# Patient Record
Sex: Female | Born: 1945
Health system: Southern US, Community
[De-identification: ages and names within clinical notes are randomized; demographics above are authoritative.]

## PROBLEM LIST (undated history)

## (undated) DIAGNOSIS — S3992XA Unspecified injury of lower back, initial encounter: Secondary | ICD-10-CM

## (undated) DIAGNOSIS — K649 Unspecified hemorrhoids: Secondary | ICD-10-CM

## (undated) DIAGNOSIS — M1990 Unspecified osteoarthritis, unspecified site: Secondary | ICD-10-CM

## (undated) DIAGNOSIS — I10 Essential (primary) hypertension: Secondary | ICD-10-CM

## (undated) DIAGNOSIS — K219 Gastro-esophageal reflux disease without esophagitis: Secondary | ICD-10-CM

## (undated) DIAGNOSIS — Z889 Allergy status to unspecified drugs, medicaments and biological substances status: Secondary | ICD-10-CM

## (undated) HISTORY — PX: TUBAL LIGATION: SHX77

## (undated) HISTORY — PX: LAMINECTOMY: SHX219

## (undated) HISTORY — PX: COLONOSCOPY: SHX174

## (undated) HISTORY — PX: CERVIX SURGERY: SHX593

---

## 2011-06-28 ENCOUNTER — Emergency Department (HOSPITAL_COMMUNITY): Payer: Self-pay

## 2011-06-28 ENCOUNTER — Inpatient Hospital Stay (HOSPITAL_COMMUNITY)
Admission: EM | Admit: 2011-06-28 | Discharge: 2011-06-30 | DRG: 917 | Disposition: A | Discharge status: Home / Self Care | Payer: Self-pay | Attending: Internal Medicine | Admitting: Internal Medicine

## 2011-06-28 ENCOUNTER — Encounter (HOSPITAL_COMMUNITY): Payer: Self-pay

## 2011-06-28 DIAGNOSIS — K219 Gastro-esophageal reflux disease without esophagitis: Secondary | ICD-10-CM

## 2011-06-28 DIAGNOSIS — G929 Unspecified toxic encephalopathy: Secondary | ICD-10-CM | POA: Diagnosis present

## 2011-06-28 DIAGNOSIS — G92 Toxic encephalopathy: Secondary | ICD-10-CM | POA: Diagnosis present

## 2011-06-28 DIAGNOSIS — G43909 Migraine, unspecified, not intractable, without status migrainosus: Secondary | ICD-10-CM | POA: Insufficient documentation

## 2011-06-28 DIAGNOSIS — J329 Chronic sinusitis, unspecified: Secondary | ICD-10-CM | POA: Diagnosis present

## 2011-06-28 DIAGNOSIS — R5383 Other fatigue: Secondary | ICD-10-CM

## 2011-06-28 DIAGNOSIS — I10 Essential (primary) hypertension: Secondary | ICD-10-CM

## 2011-06-28 DIAGNOSIS — G9340 Encephalopathy, unspecified: Secondary | ICD-10-CM | POA: Diagnosis present

## 2011-06-28 DIAGNOSIS — R5381 Other malaise: Secondary | ICD-10-CM | POA: Diagnosis present

## 2011-06-28 DIAGNOSIS — E876 Hypokalemia: Secondary | ICD-10-CM | POA: Diagnosis present

## 2011-06-28 DIAGNOSIS — R4182 Altered mental status, unspecified: Secondary | ICD-10-CM

## 2011-06-28 DIAGNOSIS — T424X1A Poisoning by benzodiazepines, accidental (unintentional), initial encounter: Secondary | ICD-10-CM | POA: Diagnosis present

## 2011-06-28 DIAGNOSIS — T400X1A Poisoning by opium, accidental (unintentional), initial encounter: Secondary | ICD-10-CM | POA: Diagnosis present

## 2011-06-28 DIAGNOSIS — T424X4A Poisoning by benzodiazepines, undetermined, initial encounter: Principal | ICD-10-CM | POA: Diagnosis present

## 2011-06-28 DIAGNOSIS — T4271XA Poisoning by unspecified antiepileptic and sedative-hypnotic drugs, accidental (unintentional), initial encounter: Secondary | ICD-10-CM | POA: Diagnosis present

## 2011-06-28 HISTORY — DX: Gastro-esophageal reflux disease without esophagitis: K21.9

## 2011-06-28 HISTORY — DX: Essential (primary) hypertension: I10

## 2011-06-28 LAB — COMPREHENSIVE METABOLIC PANEL
AST: 21 U/L (ref 0–37)
BUN: 15 mg/dL (ref 6–23)
CO2: 32 mEq/L (ref 19–32)
Calcium: 9.3 mg/dL (ref 8.4–10.5)
Creatinine, Ser: 0.77 mg/dL (ref 0.50–1.10)
GFR calc non Af Amer: 88 mL/min — ABNORMAL LOW (ref >90)

## 2011-06-28 LAB — AMMONIA: Ammonia: 57 umol/L (ref 11–60)

## 2011-06-28 LAB — BLOOD GAS, ARTERIAL
Acid-Base Excess: 4.4 mmol/L — ABNORMAL HIGH (ref 0.0–2.0)
Bicarbonate: 28.8 mEq/L — ABNORMAL HIGH (ref 20.0–24.0)
TCO2: 25.3 mmol/L (ref 0–100)
pCO2 arterial: 45.7 mmHg — ABNORMAL HIGH (ref 35.0–45.0)
pO2, Arterial: 80.7 mmHg (ref 80.0–100.0)

## 2011-06-28 LAB — SALICYLATE LEVEL: Salicylate Lvl: <2 mg/dL — ABNORMAL LOW (ref 2.8–20.0)

## 2011-06-28 LAB — HEPATIC FUNCTION PANEL
AST: 22 U/L (ref 0–37)
Bilirubin, Direct: 0.1 mg/dL (ref 0.0–0.3)
Indirect Bilirubin: 0.5 mg/dL (ref 0.3–0.9)

## 2011-06-28 LAB — DIFFERENTIAL
Basophils Absolute: 0.1 10*3/uL (ref 0.0–0.1)
Basophils Relative: 1 % (ref 0–1)
Neutro Abs: 2.6 10*3/uL (ref 1.7–7.7)
Neutrophils Relative %: 43 % (ref 43–77)

## 2011-06-28 LAB — URINALYSIS, ROUTINE W REFLEX MICROSCOPIC
Nitrite: NEGATIVE
Specific Gravity, Urine: 1.01 (ref 1.005–1.030)
Urobilinogen, UA: 0.2 mg/dL (ref 0.0–1.0)

## 2011-06-28 LAB — RAPID URINE DRUG SCREEN, HOSP PERFORMED
Barbiturates: NOT DETECTED
Cocaine: NOT DETECTED
Opiates: POSITIVE — AB

## 2011-06-28 LAB — URINE CULTURE
Culture  Setup Time: 201301120151
Culture: NO GROWTH

## 2011-06-28 LAB — PROTIME-INR: Prothrombin Time: 14 seconds (ref 11.6–15.2)

## 2011-06-28 LAB — MRSA PCR SCREENING: MRSA by PCR: NEGATIVE

## 2011-06-28 LAB — CARDIAC PANEL(CRET KIN+CKTOT+MB+TROPI): Total CK: 36 U/L (ref 7–177)

## 2011-06-28 LAB — CBC
MCHC: 34.9 g/dL (ref 30.0–36.0)
RDW: 13.6 % (ref 11.5–15.5)

## 2011-06-28 LAB — ETHANOL: Alcohol, Ethyl (B): <11 mg/dL (ref 0–11)

## 2011-06-28 LAB — APTT: aPTT: 32 seconds (ref 24–37)

## 2011-06-28 MED ORDER — ASPIRIN 300 MG RE SUPP
300.0000 mg | Freq: Every day | RECTAL | Status: DC
  Filled 2011-06-28 (×2): qty 1

## 2011-06-28 MED ORDER — FLUMAZENIL 0.5 MG/5ML IV SOLN
INTRAVENOUS | Status: AC
  Administered 2011-06-28: 0.2 mg
  Filled 2011-06-28: qty 5

## 2011-06-28 MED ORDER — ENOXAPARIN SODIUM 40 MG/0.4ML ~~LOC~~ SOLN
40.0000 mg | SUBCUTANEOUS | Status: DC
  Administered 2011-06-29 – 2011-06-30 (×2): 40 mg via SUBCUTANEOUS
  Filled 2011-06-28 (×3): qty 0.4

## 2011-06-28 MED ORDER — FLEET ENEMA 7-19 GM/118ML RE ENEM: 1.0000 | ENEMA | Freq: Once | RECTAL | Status: AC | PRN

## 2011-06-28 MED ORDER — POTASSIUM CHLORIDE 10 MEQ/100ML IV SOLN
10.0000 meq | INTRAVENOUS | Status: AC
  Administered 2011-06-28 – 2011-06-29 (×4): 10 meq via INTRAVENOUS
  Filled 2011-06-28: qty 100
  Filled 2011-06-28: qty 300

## 2011-06-28 MED ORDER — ONDANSETRON HCL 4 MG/2ML IJ SOLN: 4.0000 mg | Freq: Four times a day (QID) | INTRAMUSCULAR | Status: DC | PRN

## 2011-06-28 MED ORDER — NALOXONE HCL 0.4 MG/ML IJ SOLN
0.4000 mg | Freq: Once | INTRAMUSCULAR | Status: AC
  Administered 2011-06-28: 0.4 mg via INTRAVENOUS
  Filled 2011-06-28: qty 1

## 2011-06-28 MED ORDER — SODIUM CHLORIDE 0.9 % IV SOLN
INTRAVENOUS | Status: DC
  Administered 2011-06-28 (×2): via INTRAVENOUS

## 2011-06-28 MED ORDER — PANTOPRAZOLE SODIUM 40 MG IV SOLR
40.0000 mg | Freq: Two times a day (BID) | INTRAVENOUS | Status: DC
  Administered 2011-06-28 – 2011-06-29 (×3): 40 mg via INTRAVENOUS
  Filled 2011-06-28 (×3): qty 40

## 2011-06-28 MED ORDER — BISACODYL 10 MG RE SUPP: 10.0000 mg | Freq: Every day | RECTAL | Status: DC | PRN

## 2011-06-28 MED ORDER — POTASSIUM CHLORIDE IN NACL 20-0.9 MEQ/L-% IV SOLN
INTRAVENOUS | Status: DC
  Administered 2011-06-28 – 2011-06-30 (×4): via INTRAVENOUS

## 2011-06-28 MED ORDER — NALOXONE HCL 1 MG/ML IJ SOLN
1.0000 mg | Freq: Once | INTRAMUSCULAR | Status: AC
  Administered 2011-06-28: 1 mg via INTRAVENOUS
  Filled 2011-06-28: qty 2

## 2011-06-28 MED ORDER — FLUMAZENIL 0.5 MG/5ML IV SOLN
0.2000 mg | INTRAVENOUS | Status: DC | PRN
  Administered 2011-06-28: 0.2 mg via INTRAVENOUS

## 2011-06-28 NOTE — ED Notes (Signed)
Pt still drowsy, will open eyes on command and answer questions, follow directions,

## 2011-06-28 NOTE — ED Notes (Signed)
Dr. Clarene Duke in to talk with spouse,  Discussed plan of care with pt and spouse

## 2011-06-28 NOTE — H&P (Signed)
PCP:   [facility]  Chief Complaint:  Unresponsiveness since this morning  HPI: Judy Watts is an 67 y.o. Caucasian female.   Brought in by her husband with a history of unresponsiveness since this morning. Husband says she takes 0.25-0.5 mg of Xanax very occasionally to help her sleep, and very rarely takes some Vicodin for her headache. Does not feel that her drowsiness drug-related to her she was wide awake this morning   Reports that she was alert and appropriate at about 10:30 this morning when he was leaving home; he was going to give out his car and he was expecting her to follow him, but she did not arrive at the garage. She returned home and found the blow open as he had left it, and found his wife lying on the floor poorly responsive, and the wound looked as if she for him and not to place over. He felt she may have had a stroke but did not bring her immediately to hospital. He feels it is about 11:30 when he found her and she arrived in the emergency room at about 3:10.  The emergency room patient was very lethargic, her speech was slurred when she was able to speak, she was poorly arousable to very deep painful stimulus. No focal signs are seen. CAT scan of the head was non-acute; she did respond to intravenous Narcan, 0.4 and then 1 mg but the response was not dramatic.  Hospitalist service was called. Initially the patient was felt to have a mild right lower facial droop. And when aroused with sternal rub she was able to give some history, could not remember anything other than she was supposed to go with her husband to take to garage today; eventually she received trial doses of flumazenil, and after about 0.6 mg he became much more alert. In situation of transfer for an MRI and was abandoned.  Husband reports she also takes one tablet of HCTZ daily  Rewiew of Systems:  The patient denies anorexia, fever, weight loss,, vision loss, decreased hearing, hoarseness, chest pain,  syncope, dyspnea on exertion, peripheral edema, balance deficits, hemoptysis, abdominal pain, melena, hematochezia, severe indigestion/heartburn, hematuria, incontinence, genital sores, muscle weakness, suspicious skin lesions, transient blindness, difficulty walking, depression, unusual weight change, abnormal bleeding, enlarged lymph nodes, angioedema, and breast masses.    Past Medical History  Diagnosis Date  . Migraine   . Hypertension   . GERD (gastroesophageal reflux disease)   . Chronic sinusitis     History reviewed. No pertinent past surgical history.  Medications:  HOME MEDS: Prior to Admission medications   Medication Sig Start Date End Date Taking? Authorizing Provider  ALPRAZolam Prudy Feeler) 0.5 MG tablet Take 0.5 mg by mouth at bedtime as needed. Sleep   Yes Historical Provider, MD  aspirin EC 81 MG tablet Take 81 mg by mouth daily.   Yes Historical Provider, MD  Biotin 5000 MCG TABS Take 1 tablet by mouth daily.   Yes Historical Provider, MD  esomeprazole (NEXIUM) 40 MG capsule Take 40 mg by mouth daily before breakfast.   Yes Historical Provider, MD  fish oil-omega-3 fatty acids 1000 MG capsule Take 1 g by mouth daily.   Yes Historical Provider, MD  fluticasone (FLONASE) 50 MCG/ACT nasal spray Place 2 sprays into the nose daily.   Yes Historical Provider, MD  gabapentin (NEURONTIN) 100 MG capsule Take 100 mg by mouth 2 (two) times daily.   Yes Historical Provider, MD  Multiple Vitamin (MULITIVITAMIN WITH MINERALS)  TABS Take 1 tablet by mouth daily.   Yes Historical Provider, MD  naproxen sodium (ALEVE) 220 MG tablet Take 220 mg by mouth 2 (two) times daily as needed. Pain   Yes Historical Provider, MD  propranolol (INDERAL) 80 MG tablet Take 80 mg by mouth 2 (two) times daily.   Yes Historical Provider, MD  VITAMIN D, ERGOCALCIFEROL, PO Take 1 tablet by mouth daily.   Yes Historical Provider, MD     Allergies:  No Known Allergies  Social History:   reports that she  has never smoked. She does not have any smokeless tobacco history on file. She reports that she does not drink alcohol or use illicit drugs.  Family History: No family history on file.   Physical Exam: Filed Vitals:   06/28/11 1949 06/28/11 2056 06/28/11 2058 06/28/11 2143  BP: 103/52 111/60 106/58 117/83  Pulse: 60 86 56 52  Temp:      TempSrc:      Resp: 19     Height:      Weight:      SpO2: 96% 97% 99% 100%   Blood pressure 117/83, pulse 52, temperature 98.1 F (36.7 C), temperature source Oral, resp. rate 19, height 5\' 8"  (1.727 m), weight 81.647 kg (180 lb), SpO2 100.00%.  GEN:  Extremely lethargic middle-aged Caucasian lady  lying in the stretcher; cooperative with exam PSYCH:  alert and oriented x2; ; does not appear anxious does not appear depressed; affect is normal HEENT: Mucous membranes pink and dry and anicteric; PERRLA; EOM intact; no cervical lymphadenopathy nor thyromegaly or carotid bruit; no JVD; Breasts:: Not examined CHEST WALL: No tenderness CHEST: Normal respiration, clear to auscultation bilaterally HEART: Regular rate and rhythm; no murmurs rubs or gallops BACK: No kyphosis or scoliosis; no CVA tenderness ABDOMEN: Obese, soft non-tender; no masses, no organomegaly, normal abdominal bowel sounds; pannus; no intertriginous candida. Rectal Exam: Not done EXTREMITIES: age-appropriate arthropathy of the hands and knees; no edema; no ulcerations. Genitalia: not examined PULSES: 2+ and symmetric SKIN: Normal hydration no rash or ulceration CNS: Cranial nerves 2-12 grossly intact no focal neurologic deficit   Labs & Imaging Results for orders placed during the hospital encounter of 06/28/11 (from the past 48 hour(s))  URINALYSIS, ROUTINE W REFLEX MICROSCOPIC     Status: Normal   Collection Time   06/28/11  3:29 PM      Component Value Range Comment   Color, Urine YELLOW  YELLOW     APPearance CLEAR  CLEAR     Specific Gravity, Urine 1.010  1.005 - 1.030       pH 6.5  5.0 - 8.0     Glucose, UA NEGATIVE  NEGATIVE (mg/dL)    Hgb urine dipstick NEGATIVE  NEGATIVE     Bilirubin Urine NEGATIVE  NEGATIVE     Ketones, ur NEGATIVE  NEGATIVE (mg/dL)    Protein, ur NEGATIVE  NEGATIVE (mg/dL)    Urobilinogen, UA 0.2  0.0 - 1.0 (mg/dL)    Nitrite NEGATIVE  NEGATIVE     Leukocytes, UA NEGATIVE  NEGATIVE  MICROSCOPIC NOT DONE ON URINES WITH NEGATIVE PROTEIN, BLOOD, LEUKOCYTES, NITRITE, OR GLUCOSE <1000 mg/dL.  URINE RAPID DRUG SCREEN (HOSP PERFORMED)     Status: Abnormal   Collection Time   06/28/11  3:29 PM      Component Value Range Comment   Opiates POSITIVE (*) NONE DETECTED     Cocaine NONE DETECTED  NONE DETECTED     Benzodiazepines POSITIVE (*)  NONE DETECTED     Amphetamines NONE DETECTED  NONE DETECTED     Tetrahydrocannabinol NONE DETECTED  NONE DETECTED     Barbiturates NONE DETECTED  NONE DETECTED    GLUCOSE, CAPILLARY     Status: Abnormal   Collection Time   06/28/11  3:39 PM      Component Value Range Comment   Glucose-Capillary 134 (*) 70 - 99 (mg/dL)   COMPREHENSIVE METABOLIC PANEL     Status: Abnormal   Collection Time   06/28/11  4:15 PM      Component Value Range Comment   Sodium 140  135 - 145 (mEq/L)    Potassium 2.8 (*) 3.5 - 5.1 (mEq/L)    Chloride 102  96 - 112 (mEq/L)    CO2 32  19 - 32 (mEq/L)    Glucose, Bld 116 (*) 70 - 99 (mg/dL)    BUN 15  6 - 23 (mg/dL)    Creatinine, Ser 2.13  0.50 - 1.10 (mg/dL)    Calcium 9.3  8.4 - 10.5 (mg/dL)    Total Protein 6.8  6.0 - 8.3 (g/dL)    Albumin 3.3 (*) 3.5 - 5.2 (g/dL)    AST 21  0 - 37 (U/L)    ALT 18  0 - 35 (U/L)    Alkaline Phosphatase 93  39 - 117 (U/L)    Total Bilirubin 0.7  0.3 - 1.2 (mg/dL)    GFR calc non Af Amer 88 (*) >90 (mL/min)    GFR calc Af Amer >90  >90 (mL/min)   APTT     Status: Normal   Collection Time   06/28/11  4:15 PM      Component Value Range Comment   aPTT 32  24 - 37 (seconds)   PROTIME-INR     Status: Normal   Collection Time   06/28/11  4:15  PM      Component Value Range Comment   Prothrombin Time 14.0  11.6 - 15.2 (seconds)    INR 1.06  0.00 - 1.49    ACETAMINOPHEN LEVEL     Status: Normal   Collection Time   06/28/11  4:15 PM      Component Value Range Comment   Acetaminophen (Tylenol), Serum <15.0  10 - 30 (ug/mL)   SALICYLATE LEVEL     Status: Abnormal   Collection Time   06/28/11  4:15 PM      Component Value Range Comment   Salicylate Lvl <2.0 (*) 2.8 - 20.0 (mg/dL)   ETHANOL     Status: Normal   Collection Time   06/28/11  4:15 PM      Component Value Range Comment   Alcohol, Ethyl (B) <11  0 - 11 (mg/dL)   CBC     Status: Normal   Collection Time   06/28/11  4:20 PM      Component Value Range Comment   WBC 6.1  4.0 - 10.5 (K/uL)    RBC 4.27  3.87 - 5.11 (MIL/uL)    Hemoglobin 13.0  12.0 - 15.0 (g/dL)    HCT 08.6  57.8 - 46.9 (%)    MCV 87.4  78.0 - 100.0 (fL)    MCH 30.4  26.0 - 34.0 (pg)    MCHC 34.9  30.0 - 36.0 (g/dL)    RDW 62.9  52.8 - 41.3 (%)    Platelets 198  150 - 400 (K/uL)   DIFFERENTIAL     Status: Normal  Collection Time   06/28/11  4:20 PM      Component Value Range Comment   Neutrophils Relative 43  43 - 77 (%)    Neutro Abs 2.6  1.7 - 7.7 (K/uL)    Lymphocytes Relative 44  12 - 46 (%)    Lymphs Abs 2.7  0.7 - 4.0 (K/uL)    Monocytes Relative 7  3 - 12 (%)    Monocytes Absolute 0.4  0.1 - 1.0 (K/uL)    Eosinophils Relative 5  0 - 5 (%)    Eosinophils Absolute 0.3  0.0 - 0.7 (K/uL)    Basophils Relative 1  0 - 1 (%)    Basophils Absolute 0.1  0.0 - 0.1 (K/uL)   AMMONIA     Status: Normal   Collection Time   06/28/11  4:20 PM      Component Value Range Comment   Ammonia 57  11 - 60 (umol/L)   BLOOD GAS, ARTERIAL     Status: Abnormal   Collection Time   06/28/11  8:30 PM      Component Value Range Comment   O2 Content 3.0      Delivery systems NASAL CANNULA      pH, Arterial 7.416 (*) 7.350 - 7.400     pCO2 arterial 45.7 (*) 35.0 - 45.0 (mmHg)    pO2, Arterial 80.7  80.0 - 100.0  (mmHg)    Bicarbonate 28.8 (*) 20.0 - 24.0 (mEq/L)    TCO2 25.3  0 - 100 (mmol/L)    Acid-Base Excess 4.4 (*) 0.0 - 2.0 (mmol/L)    O2 Saturation 96.6      Patient temperature 37.0      Collection site RADIAL      Drawn by COLLECTED BY RT      Sample type ARTERIAL      Allens test (pass/fail) PASS  PASS     Ct Head Wo Contrast  06/28/2011  *RADIOLOGY REPORT*  Clinical Data: Altered mental status.  CT HEAD WITHOUT CONTRAST  Technique:  Contiguous axial images were obtained from the base of the skull through the vertex without contrast.  Comparison: None.  Findings: There is no evidence for acute infarction, intracranial hemorrhage, mass lesion, hydrocephalus, or extra-axial fluid.  Mild cerebral atrophy is present.  There is no definite white matter disease.  The calvarium is intact.  There is moderately extensive changes of sinusitis with fluid accumulation in the maxillary sinuses (incompletely evaluated), ethmoid sinuses, right division of the sphenoid sinus, and left greater than right frontal sinuses. No definite air-fluid levels are seen suggesting these changes may be chronic.  There is no mastoid fluid. Orbital structures grossly unremarkable.  IMPRESSION: Mild cerebral atrophy.  No visible acute intracranial abnormality.  Moderately extensive pansinus opacity without air-fluid levels. Question chronic sinusitis.  Original Report Authenticated By: Elsie Stain, M.D.   Dg Chest Port 1 View  06/28/2011  *RADIOLOGY REPORT*  Clinical Data: Lethargic.  Rule out infiltrate.  Stroke like symptoms.  PORTABLE CHEST - 1 VIEW  Comparison: None.  Findings: The film is made with very shallow inflation.  There are streaky densities at the bases, consistent with atelectasis or early infiltrates.  The heart is enlarged.  No definite edema. There is perihilar atelectasis.  IMPRESSION:  1.  Very shallow lung inflation. 2.  Bibasilar atelectasis or early infiltrates.  Original Report Authenticated By: Patterson Hammersmith, M.D.      Assessment Present on Admission:   .Sedative  or hypnotic overdose / toxic metabolic encephalopathy improving .GERD (gastroesophageal reflux disease) .Chronic sinusitis .Hypertension .Lethargy .Hypokalemia - due to diuretic use    PLAN: We'll admit this lady for hydration and supportive care and allow hypertrophic drugs to be metabolized from her system; The husband's history we'll need to be further clarified when patient is more alert; need to rule out labor drug overdose, versus surreptitious prescription drug, versus foul play.  Other plans as per orders.  Critical care time: 60 minutes.  Genelda Roark 06/28/2011, 10:03 PM

## 2011-06-28 NOTE — ED Notes (Signed)
Called lab in reference uds results, lab advised that it should not be much longer

## 2011-06-28 NOTE — ED Notes (Signed)
Pt brought in by husband for possible stroke. Per pt she has been lethargic for 2 days. Pt denies drugs and ETOH.

## 2011-06-28 NOTE — ED Notes (Signed)
Dr. McManus in prior to RN, see EDP assessment for further.  

## 2011-06-28 NOTE — ED Provider Notes (Signed)
History   This chart was scribed for Judy Anger, DO by Charolett Bumpers . The patient was seen in room APA01/APA01 and the patient's care was started at 3:36pm.   CSN: [id number]  Arrival date & time 06/28/11  1510   Chief Complaint  Patient presents with  . Altered Mental Status     The history is provided by the patient and the spouse. The history is limited by the condition of the patient (AMS, lethargy).  Pt was seen at 1535. Marque Schuchart is a 66 y.o. female who was brought to the Emergency Department by her husband c/o gradual onset and persistence of AMS since this morning.  Pt's husband states he left the house approx 10am and pt was at her baseline; he returned home PTA and found pt in this condition.  Pt's husband describes the AMS as lethargy, generalized weakness, slurred speech and "unable to stand on her own."  Husband reports that the patient has not been recently sick.  No reported fevers, no N/V/D, no cough, no rash.     Past Medical History  Diagnosis Date  . Migraine     History reviewed. No pertinent past surgical history.   History  Substance Use Topics  . Smoking status: Never Smoker   . Smokeless tobacco: Not on file  . Alcohol Use: No    Review of Systems  Unable to perform ROS: Mental status change    Allergies  Review of patient's allergies indicates no known allergies.  Home Medications  No current outpatient prescriptions on file.  BP 119/73  Pulse 61  Temp(Src) 98.1 F (36.7 C) (Oral)  Resp 16  Ht 5\' 8"  (1.727 m)  Wt 180 lb (81.647 kg)  BMI 27.37 kg/m2  SpO2 94%  Physical Exam  Nursing note and vitals reviewed. 1540: Physical examination:  Nursing notes reviewed; Vital signs and O2 SAT reviewed;  Constitutional: Well developed, Well nourished, Well hydrated, In no acute distress; Head:  Normocephalic, atraumatic; Eyes: EOMI, PERRL, No scleral icterus; ENMT: Mouth and pharynx normal, Mucous membranes moist; Neck:  Supple, Full range of motion, No lymphadenopathy; Cardiovascular: Regular rate and rhythm, No murmur, rub, or gallop; Respiratory: Breath sounds clear & equal bilaterally, No rales, rhonchi, wheezes, or rub, Normal respiratory effort/excursion; Chest: Nontender, Movement normal; Abdomen: Soft, Nontender, Nondistended, Normal bowel sounds; Extremities: Pulses normal, No tenderness, No edema, No calf edema or asymmetry.; Neuro: Lethargic, but awakens to name and touch.  Recognizes her significant other at bedside.  No facial droop, speech slurred.  Strength 4/5 bilat UE's and LE's, no drift.; Skin: Color normal, Warm, Dry, no rash.     ED Course  Procedures   1600:  Pt somewhat more awake after IV narcan.  ED RN states pt was able to push herself up on the stretcher to reposition herself without assist, but then fell back asleep.   1800:  Continues lethargic, but will follow commands/directions, open eyes and answer questions appropriately.  Pt now endorses she takes xanax to help her sleep and vicodin for headaches.  1900:  Pt continues lethargic, but opens her eyes to name, speaks appropriately, moves all ext well and follows commands.  Afebrile, VSS.  Doubt meningitis or encephalitis at this time.  Doubt ICH or CVA/TIA given non-focal neuro exam, negative CT-H.  Will give another IV narcan dose.  Pt maintaining her own airway well.  Will hold IV romazicon at this time due to concern regarding chronic benzo use and high risk  of rapid withdrawal/seizure.  T/C to [facility] Dr. Orvan Falconer, case discussed, including:  HPI, pertinent PM/SHx, VS/PE, dx testing, ED course and treatment.  Agreeable to admit.  Requests to obtain ICU bed and obtain ABG.     MDM  MDM Reviewed: nursing note and vitals Interpretation: ECG, labs, x-ray and CT scan   CRITICAL CARE Performed by: Judy Watts Total critical care time: 31 Critical care time was exclusive of separately billable procedures and treating other  patients. Critical care was necessary to treat or prevent imminent or life-threatening deterioration. Critical care was time spent personally by me on the following activities: development of treatment plan with patient and/or surrogate as well as nursing, discussions with consultants, evaluation of patient's response to treatment, examination of patient, obtaining history from patient or surrogate, ordering and performing treatments and interventions, ordering and review of laboratory studies, ordering and review of radiographic studies, pulse oximetry and re-evaluation of patient's condition.    Date: 06/28/2011  Rate: 64  Rhythm: normal sinus rhythm and premature atrial contractions (PAC)  QRS Axis: normal  Intervals: normal  ST/T Wave abnormalities: normal  Conduction Disutrbances:none  Narrative Interpretation:   Old EKG Reviewed: none available.  Results for orders placed during the hospital encounter of 06/28/11  CBC      Component Value Range   WBC 6.1  4.0 - 10.5 (K/uL)   RBC 4.27  3.87 - 5.11 (MIL/uL)   Hemoglobin 13.0  12.0 - 15.0 (g/dL)   HCT 70.3  50.0 - 93.8 (%)   MCV 87.4  78.0 - 100.0 (fL)   MCH 30.4  26.0 - 34.0 (pg)   MCHC 34.9  30.0 - 36.0 (g/dL)   RDW 18.2  99.3 - 71.6 (%)   Platelets 198  150 - 400 (K/uL)  DIFFERENTIAL      Component Value Range   Neutrophils Relative 43  43 - 77 (%)   Neutro Abs 2.6  1.7 - 7.7 (K/uL)   Lymphocytes Relative 44  12 - 46 (%)   Lymphs Abs 2.7  0.7 - 4.0 (K/uL)   Monocytes Relative 7  3 - 12 (%)   Monocytes Absolute 0.4  0.1 - 1.0 (K/uL)   Eosinophils Relative 5  0 - 5 (%)   Eosinophils Absolute 0.3  0.0 - 0.7 (K/uL)   Basophils Relative 1  0 - 1 (%)   Basophils Absolute 0.1  0.0 - 0.1 (K/uL)  URINALYSIS, ROUTINE W REFLEX MICROSCOPIC      Component Value Range   Color, Urine YELLOW  YELLOW    APPearance CLEAR  CLEAR    Specific Gravity, Urine 1.010  1.005 - 1.030    pH 6.5  5.0 - 8.0    Glucose, UA NEGATIVE  NEGATIVE  (mg/dL)   Hgb urine dipstick NEGATIVE  NEGATIVE    Bilirubin Urine NEGATIVE  NEGATIVE    Ketones, ur NEGATIVE  NEGATIVE (mg/dL)   Protein, ur NEGATIVE  NEGATIVE (mg/dL)   Urobilinogen, UA 0.2  0.0 - 1.0 (mg/dL)   Nitrite NEGATIVE  NEGATIVE    Leukocytes, UA NEGATIVE  NEGATIVE   URINE RAPID DRUG SCREEN (HOSP PERFORMED)      Component Value Range   Opiates POSITIVE (*) NONE DETECTED    Cocaine NONE DETECTED  NONE DETECTED    Benzodiazepines POSITIVE (*) NONE DETECTED    Amphetamines NONE DETECTED  NONE DETECTED    Tetrahydrocannabinol NONE DETECTED  NONE DETECTED    Barbiturates NONE DETECTED  NONE DETECTED  GLUCOSE, CAPILLARY      Component Value Range   Glucose-Capillary 134 (*) 70 - 99 (mg/dL)  COMPREHENSIVE METABOLIC PANEL      Component Value Range   Sodium 140  135 - 145 (mEq/L)   Potassium 2.8 (*) 3.5 - 5.1 (mEq/L)   Chloride 102  96 - 112 (mEq/L)   CO2 32  19 - 32 (mEq/L)   Glucose, Bld 116 (*) 70 - 99 (mg/dL)   BUN 15  6 - 23 (mg/dL)   Creatinine, Ser 1.61  0.50 - 1.10 (mg/dL)   Calcium 9.3  8.4 - 09.6 (mg/dL)   Total Protein 6.8  6.0 - 8.3 (g/dL)   Albumin 3.3 (*) 3.5 - 5.2 (g/dL)   AST 21  0 - 37 (U/L)   ALT 18  0 - 35 (U/L)   Alkaline Phosphatase 93  39 - 117 (U/L)   Total Bilirubin 0.7  0.3 - 1.2 (mg/dL)   GFR calc non Af Amer 88 (*) >90 (mL/min)   GFR calc Af Amer >90  >90 (mL/min)  AMMONIA      Component Value Range   Ammonia 57  11 - 60 (umol/L)  APTT      Component Value Range   aPTT 32  24 - 37 (seconds)  PROTIME-INR      Component Value Range   Prothrombin Time 14.0  11.6 - 15.2 (seconds)   INR 1.06  0.00 - 1.49   ACETAMINOPHEN LEVEL      Component Value Range   Acetaminophen (Tylenol), Serum <15.0  10 - 30 (ug/mL)  SALICYLATE LEVEL      Component Value Range   Salicylate Lvl <2.0 (*) 2.8 - 20.0 (mg/dL)   Ct Head Wo Contrast 06/28/2011  *RADIOLOGY REPORT*  Clinical Data: Altered mental status.  CT HEAD WITHOUT CONTRAST  Technique:  Contiguous  axial images were obtained from the base of the skull through the vertex without contrast.  Comparison: None.  Findings: There is no evidence for acute infarction, intracranial hemorrhage, mass lesion, hydrocephalus, or extra-axial fluid.  Mild cerebral atrophy is present.  There is no definite white matter disease.  The calvarium is intact.  There is moderately extensive changes of sinusitis with fluid accumulation in the maxillary sinuses (incompletely evaluated), ethmoid sinuses, right division of the sphenoid sinus, and left greater than right frontal sinuses. No definite air-fluid levels are seen suggesting these changes may be chronic.  There is no mastoid fluid. Orbital structures grossly unremarkable.  IMPRESSION: Mild cerebral atrophy.  No visible acute intracranial abnormality.  Moderately extensive pansinus opacity without air-fluid levels. Question chronic sinusitis.  Original Report Authenticated By: Elsie Stain, M.D.    Dg Chest Port 1 View 06/28/2011  *RADIOLOGY REPORT*  Clinical Data: Lethargic.  Rule out infiltrate.  Stroke like symptoms.  PORTABLE CHEST - 1 VIEW  Comparison: None.  Findings: The film is made with very shallow inflation.  There are streaky densities at the bases, consistent with atelectasis or early infiltrates.  The heart is enlarged.  No definite edema. There is perihilar atelectasis.  IMPRESSION:  1.  Very shallow lung inflation. 2.  Bibasilar atelectasis or early infiltrates.  Original Report Authenticated By: Patterson Hammersmith, M.D.        [facility] I personally performed the services described in this documentation, which was scribed in my presence. The recorded information has been reviewed and considered.    Judy Anger, DO 07/01/11 1300

## 2011-06-29 DIAGNOSIS — G9340 Encephalopathy, unspecified: Secondary | ICD-10-CM | POA: Diagnosis present

## 2011-06-29 DIAGNOSIS — T424X1A Poisoning by benzodiazepines, accidental (unintentional), initial encounter: Secondary | ICD-10-CM | POA: Diagnosis present

## 2011-06-29 LAB — CBC
MCH: 31.1 pg (ref 26.0–34.0)
MCHC: 35.2 g/dL (ref 30.0–36.0)
MCV: 88.5 fL (ref 78.0–100.0)
Platelets: 178 10*3/uL (ref 150–400)
RDW: 13.5 % (ref 11.5–15.5)

## 2011-06-29 LAB — BASIC METABOLIC PANEL
CO2: 33 mEq/L — ABNORMAL HIGH (ref 19–32)
Calcium: 8.7 mg/dL (ref 8.4–10.5)
Creatinine, Ser: 0.62 mg/dL (ref 0.50–1.10)
Glucose, Bld: 98 mg/dL (ref 70–99)

## 2011-06-29 LAB — TSH: TSH: 0.435 u[IU]/mL (ref 0.350–4.500)

## 2011-06-29 MED ORDER — ACETAMINOPHEN 500 MG PO TABS
1000.0000 mg | ORAL_TABLET | Freq: Four times a day (QID) | ORAL | Status: DC | PRN
  Administered 2011-06-29: 1000 mg via ORAL
  Filled 2011-06-29: qty 2

## 2011-06-29 MED ORDER — SODIUM CHLORIDE 0.9 % IJ SOLN
INTRAMUSCULAR | Status: AC
  Filled 2011-06-29: qty 3

## 2011-06-29 MED ORDER — ASPIRIN EC 81 MG PO TBEC
81.0000 mg | DELAYED_RELEASE_TABLET | Freq: Every day | ORAL | Status: DC
  Administered 2011-06-29 – 2011-06-30 (×2): 81 mg via ORAL
  Filled 2011-06-29 (×2): qty 1

## 2011-06-29 MED ORDER — SODIUM CHLORIDE 0.9 % IJ SOLN
INTRAMUSCULAR | Status: AC
  Administered 2011-06-29: 11:00:00
  Filled 2011-06-29: qty 3

## 2011-06-29 MED ORDER — POTASSIUM CHLORIDE CRYS ER 20 MEQ PO TBCR
40.0000 meq | EXTENDED_RELEASE_TABLET | Freq: Once | ORAL | Status: AC
  Administered 2011-06-29: 40 meq via ORAL
  Filled 2011-06-29: qty 2

## 2011-06-29 NOTE — Progress Notes (Signed)
Pt to be transferred to room 332 per MD order. Report called to RN. Pt aware of transfer. Family member at bedside. Pt transferred via wheelchair with belongings.

## 2011-06-29 NOTE — Progress Notes (Signed)
Subjective: Appears to be more awake today, still confused. Denies any complaints  Objective: Vital signs in last 24 hours: Temp:  [98.1 F (36.7 C)-98.5 F (36.9 C)] 98.5 F (36.9 C) (01/12 0400) Pulse Rate:  [50-86] 60  (01/12 0800) Resp:  [11-20] 19  (01/12 0800) BP: (97-138)/(52-96) 124/84 mmHg (01/12 0800) SpO2:  [94 %-100 %] 99 % (01/12 0816) Weight:  [73.7 kg (162 lb 7.7 oz)-81.647 kg (180 lb)] 73.8 kg (162 lb 11.2 oz) (01/12 0500) Weight change:  Last BM Date: 06/26/11  Intake/Output from previous day: 01/11 0701 - 01/12 0700 In: 1208.3 [I.V.:808.3; IV Piggyback:400] Out: 300 [Urine:300] Total I/O In: 100 [I.V.:100] Out: -    Physical Exam: General: Alert, awake, oriented x3, in no acute distress. HEENT: No bruits, no goiter. Heart: Regular rate and rhythm, without murmurs, rubs, gallops. Lungs: Clear to auscultation bilaterally. Abdomen: Soft, nontender, nondistended, positive bowel sounds. Extremities: No clubbing cyanosis or edema with positive pedal pulses. Neuro: Grossly intact, nonfocal.    Lab Results: Basic Metabolic Panel:  Basename 06/29/11 0516 06/28/11 2319 06/28/11 1615  NA 141 -- 140  K 3.3* -- 2.8*  CL 106 -- 102  CO2 33* -- 32  GLUCOSE 98 -- 116*  BUN 11 -- 15  CREATININE 0.62 -- 0.77  CALCIUM 8.7 -- 9.3  MG -- 1.8 --  PHOS -- -- --   Liver Function Tests:  [facility] 06/28/11 2319 06/28/11 1615  AST 22 21  ALT 18 18  ALKPHOS 84 93  BILITOT 0.6 0.7  PROT 6.1 6.8  ALBUMIN 2.9* 3.3*   No results found for this basename: LIPASE:2,AMYLASE:2 in the last 72 hours  Basename 06/28/11 1620  AMMONIA 57   CBC:  Basename 06/29/11 0516 06/28/11 1620  WBC 5.4 6.1  NEUTROABS -- 2.6  HGB 12.7 13.0  HCT 36.1 37.3  MCV 88.5 87.4  PLT 178 198   Cardiac Enzymes:  Basename 06/28/11 2319  CKTOTAL 36  CKMB 1.3  CKMBINDEX --  TROPONINI <0.30   BNP: No results found for this basename: PROBNP:3 in the last 72 hours D-Dimer: No  results found for this basename: DDIMER:2 in the last 72 hours CBG:  Basename 06/28/11 1539  GLUCAP 134*   Hemoglobin A1C: No results found for this basename: HGBA1C in the last 72 hours Fasting Lipid Panel:  Basename 06/29/11 0516  CHOL 172  HDL 41  LDLCALC 95  TRIG 179*  CHOLHDL 4.2  LDLDIRECT --   Thyroid Function Tests: No results found for this basename: TSH,T4TOTAL,FREET4,T3FREE,THYROIDAB in the last 72 hours Anemia Panel: No results found for this basename: VITAMINB12,FOLATE,FERRITIN,TIBC,IRON,RETICCTPCT in the last 72 hours Coagulation:  Basename 06/28/11 1615  LABPROT 14.0  INR 1.06   Urine Drug Screen: Drugs of Abuse     Component Value Date/Time   LABOPIA POSITIVE* 06/28/2011 1529   COCAINSCRNUR NONE DETECTED 06/28/2011 1529   LABBENZ POSITIVE* 06/28/2011 1529   AMPHETMU NONE DETECTED 06/28/2011 1529   THCU NONE DETECTED 06/28/2011 1529   LABBARB NONE DETECTED 06/28/2011 1529    Alcohol Level:  Basename 06/28/11 1615  ETH <11    Recent Results (from the past 240 hour(s))  MRSA PCR SCREENING     Status: Normal   Collection Time   06/28/11 10:19 PM      Component Value Range Status Comment   MRSA by PCR NEGATIVE  NEGATIVE  Final     Studies/Results: Ct Head Wo Contrast  06/28/2011  *RADIOLOGY REPORT*  Clinical Data: Altered mental  status.  CT HEAD WITHOUT CONTRAST  Technique:  Contiguous axial images were obtained from the base of the skull through the vertex without contrast.  Comparison: None.  Findings: There is no evidence for acute infarction, intracranial hemorrhage, mass lesion, hydrocephalus, or extra-axial fluid.  Mild cerebral atrophy is present.  There is no definite white matter disease.  The calvarium is intact.  There is moderately extensive changes of sinusitis with fluid accumulation in the maxillary sinuses (incompletely evaluated), ethmoid sinuses, right division of the sphenoid sinus, and left greater than right frontal sinuses. No definite  air-fluid levels are seen suggesting these changes may be chronic.  There is no mastoid fluid. Orbital structures grossly unremarkable.  IMPRESSION: Mild cerebral atrophy.  No visible acute intracranial abnormality.  Moderately extensive pansinus opacity without air-fluid levels. Question chronic sinusitis.  Original Report Authenticated By: Elsie Stain, M.D.   Dg Chest Port 1 View  06/28/2011  *RADIOLOGY REPORT*  Clinical Data: Lethargic.  Rule out infiltrate.  Stroke like symptoms.  PORTABLE CHEST - 1 VIEW  Comparison: None.  Findings: The film is made with very shallow inflation.  There are streaky densities at the bases, consistent with atelectasis or early infiltrates.  The heart is enlarged.  No definite edema. There is perihilar atelectasis.  IMPRESSION:  1.  Very shallow lung inflation. 2.  Bibasilar atelectasis or early infiltrates.  Original Report Authenticated By: Patterson Hammersmith, M.D.    Medications: Scheduled Meds:   . aspirin  300 mg Rectal Daily  . enoxaparin  40 mg Subcutaneous Q24H  . flumazenil      . naloxone (NARCAN) injection  0.4 mg Intravenous Once  . naloxone (NARCAN) injection  1 mg Intravenous Once  . pantoprazole (PROTONIX) IV  40 mg Intravenous Q12H  . potassium chloride  10 mEq Intravenous Q1 Hr x 4  . sodium chloride       Continuous Infusions:   . 0.9 % NaCl with KCl 20 mEq / L 100 mL/hr at 06/29/11 0800  . DISCONTD: sodium chloride 100 mL/hr at 06/28/11 2138   PRN Meds:.bisacodyl, ondansetron (ZOFRAN) IV, sodium phosphate, DISCONTD: flumazenil  Assessment/Plan:  Active Problems:  Hypertension  GERD (gastroesophageal reflux disease)  Chronic sinusitis  Lethargy  Hypokalemia  Sedative or hypnotic overdose  Encephalopathy  Overdose of benzodiazepine   Plan:  Patient's mental status appears to have significantly improved since yesterday.  She is awake and conversing now, but does appear to still be a little confused.  She is denying being on  any medications especially xanax.  Her urine tox was positive for benzo's and opiates.  She had some improvement with flumazenil and narcan.  CT head was negative.  The context of her overdose is not entirely clear at this point.  She is denying any suicidal intent, although her memory/mental status does not appear entirely clear. We will continue to follow her for another 24 hours and track any further improvement.  Continue IVF.  Transfer to floor. Continue to replace potassium.   LOS: 1 day   Garnetta Fedrick [facility] 06/29/2011, 8:26 AM

## 2011-06-30 LAB — BASIC METABOLIC PANEL
CO2: 24 mEq/L (ref 19–32)
GFR calc non Af Amer: >90 mL/min (ref >90)
Glucose, Bld: 111 mg/dL — ABNORMAL HIGH (ref 70–99)
Potassium: 3.6 mEq/L (ref 3.5–5.1)
Sodium: 140 mEq/L (ref 135–145)

## 2011-06-30 MED ORDER — PANTOPRAZOLE SODIUM 40 MG PO TBEC
40.0000 mg | DELAYED_RELEASE_TABLET | Freq: Two times a day (BID) | ORAL | Status: DC
  Administered 2011-06-30: 40 mg via ORAL
  Filled 2011-06-30: qty 1

## 2011-06-30 MED ORDER — PROPRANOLOL HCL 80 MG PO TABS: 40.0000 mg | ORAL_TABLET | Freq: Two times a day (BID) | ORAL | Status: AC

## 2011-06-30 NOTE — Progress Notes (Signed)
Discharge instructions and prescriptions given, verbalized understanding, out ambulatory in stable condition with staff. 

## 2011-06-30 NOTE — Discharge Summary (Signed)
Physician Discharge Summary  Patient ID: Judy Watts MRN: [id number] DOB/AGE: 10/07/1945 66 y.o.  Admit date: 06/28/2011 Discharge date: 06/30/2011  Primary Care Physician:  No primary provider on file.   Discharge Diagnoses:    Active Problems:  Hypertension  GERD (gastroesophageal reflux disease)  Chronic sinusitis  Lethargy  Hypokalemia  Sedative or hypnotic overdose  Encephalopathy  Overdose of benzodiazepine    Current Discharge Medication List    CONTINUE these medications which have CHANGED   Details  propranolol (INDERAL) 80 MG tablet Take 0.5 tablets (40 mg total) by mouth 2 (two) times daily. Qty: 60 tablet, Refills: 0      CONTINUE these medications which have NOT CHANGED   Details  aspirin EC 81 MG tablet Take 81 mg by mouth daily.    Biotin 5000 MCG TABS Take 1 tablet by mouth daily.    esomeprazole (NEXIUM) 40 MG capsule Take 40 mg by mouth daily before breakfast.    fish oil-omega-3 fatty acids 1000 MG capsule Take 1 g by mouth daily.    fluticasone (FLONASE) 50 MCG/ACT nasal spray Place 2 sprays into the nose daily.    gabapentin (NEURONTIN) 100 MG capsule Take 100 mg by mouth 2 (two) times daily.    Multiple Vitamin (MULITIVITAMIN WITH MINERALS) TABS Take 1 tablet by mouth daily.    naproxen sodium (ALEVE) 220 MG tablet Take 220 mg by mouth 2 (two) times daily as needed. Pain    VITAMIN D, ERGOCALCIFEROL, PO Take 1 tablet by mouth daily.      STOP taking these medications     ALPRAZolam (XANAX) 0.5 MG tablet        Discharge Exam: Blood pressure 132/85, pulse 61, temperature 97.4 F (36.3 C), temperature source Oral, resp. rate 18, height 5\' 9"  (1.753 m), weight 73.8 kg (162 lb 11.2 oz), SpO2 97.00%. General: Patient is awake, alert, oriented x3, cooperative with exam HEENT: Normocephalic, atraumatic, pupils are round and reactive to light Neck: Supple Chest: Clear to auscultation bilaterally Cardiac: S1, S2, regular rate and rhythm 9  abdomen: Soft, nontender, nondistended, bowel sounds are active Extremities: No cyanosis, clubbing, or edema Neurologic: Nonfocal, grossly intact  Disposition and Follow-up:  Follow up with primary doctor in 1 week.  Consults:  None   Significant Diagnostic Studies:  Ct Head Wo Contrast  06/28/2011  *RADIOLOGY REPORT*  Clinical Data: Altered mental status.  CT HEAD WITHOUT CONTRAST  Technique:  Contiguous axial images were obtained from the base of the skull through the vertex without contrast.  Comparison: None.  Findings: There is no evidence for acute infarction, intracranial hemorrhage, mass lesion, hydrocephalus, or extra-axial fluid.  Mild cerebral atrophy is present.  There is no definite white matter disease.  The calvarium is intact.  There is moderately extensive changes of sinusitis with fluid accumulation in the maxillary sinuses (incompletely evaluated), ethmoid sinuses, right division of the sphenoid sinus, and left greater than right frontal sinuses. No definite air-fluid levels are seen suggesting these changes may be chronic.  There is no mastoid fluid. Orbital structures grossly unremarkable.  IMPRESSION: Mild cerebral atrophy.  No visible acute intracranial abnormality.  Moderately extensive pansinus opacity without air-fluid levels. Question chronic sinusitis.  Original Report Authenticated By: Elsie Stain, M.D.   Dg Chest Port 1 View  06/28/2011  *RADIOLOGY REPORT*  Clinical Data: Lethargic.  Rule out infiltrate.  Stroke like symptoms.  PORTABLE CHEST - 1 VIEW  Comparison: None.  Findings: The film is made with very shallow  inflation.  There are streaky densities at the bases, consistent with atelectasis or early infiltrates.  The heart is enlarged.  No definite edema. There is perihilar atelectasis.  IMPRESSION:  1.  Very shallow lung inflation. 2.  Bibasilar atelectasis or early infiltrates.  Original Report Authenticated By: Patterson Hammersmith, M.D.    Brief H and P: For  complete details please refer to admission H and P, but in brief Judy Watts is an 66 y.o. Caucasian female. Brought in by her husband with a history of unresponsiveness since this morning.  Husband says she takes 0.25-0.5 mg of Xanax very occasionally to help her sleep, and very rarely takes some Vicodin for her headache. Does not feel that her drowsiness drug-related to her she was wide awake this morning  Reports that she was alert and appropriate at about 10:30 this morning when he was leaving home; he was going to give out his car and he was expecting her to follow him, but she did not arrive at the garage. She returned home and found the blow open as he had left it, and found his wife lying on the floor poorly responsive, and the wound looked as if she for him and not to place over. He felt she may have had a stroke but did not bring her immediately to hospital. He feels it is about 11:30 when he found her and she arrived in the emergency room at about 3:10.  The emergency room patient was very lethargic, her speech was slurred when she was able to speak, she was poorly arousable to very deep painful stimulus. No focal signs are seen. CAT scan of the head was non-acute; she did respond to intravenous Narcan, 0.4 and then 1 mg but the response was not dramatic.  Hospitalist service was called. Initially the patient was felt to have a mild right lower facial droop. And when aroused with sternal rub she was able to give some history, could not remember anything other than she was supposed to go with her husband to take to garage today; eventually she received trial doses of flumazenil, and after about 0.6 mg he became much more alert. In situation of transfer for an MRI and was abandoned.  Husband reports she also takes one tablet of HCTZ daily     Hospital Course:  This is a 66 year old female who was brought into the emergency room by her husband after being found unresponsive. Her husband reports  that she occasionally takes Xanax to help her sleep. She was evaluated in the emergency room where a CT scan of the head was found to be negative for any acute process. Her urinalysis did not show any signs of infection. Her urine tox screen was positive for benzos and opiates. Patient did receive a dose of Narcan with mild improvement. She subsequently received a dose of flumazenil and became much more alert. She was admitted to the step down unit for closer monitoring of a possible overdose. Patient was treated with supportive measures, her Xanax was held. Today she is much more awake, alert, appropriate, back to baseline. She reports that she occasionally takes Xanax at night to help her sleep. This is not prescribed to her, it is prescribed to her husband and they share medicine. She reports occasionally taking a Norco tablet as well for headache. She reports taking these medicines on the day prior to admission. She is not forthcoming as to how much medication she truly takes. She repeatedly denies any suicidal  or homicidal intent. She does not have any prior history of suicide attempt. She denies any signs of depression. It appears this was an accidental overdose. She was strongly advised not to take medicine that is not directly prescribed to her by her primary care provider. She was encouraged not to share pills with her husband. The patient does not have any focal deficits at this time. She is requesting discharge home. She will followup with her primary care physician next 1 week.  Time spent on Discharge:  Signed: Kohner Orlick [facility] 06/30/2011, 2:41 PM

## 2013-11-04 ENCOUNTER — Other Ambulatory Visit (HOSPITAL_COMMUNITY): Payer: Self-pay | Admitting: Internal Medicine

## 2013-11-04 ENCOUNTER — Ambulatory Visit (HOSPITAL_COMMUNITY)
Admission: RE | Admit: 2013-11-04 | Discharge: 2013-11-04 | Disposition: A | Discharge status: Home / Self Care | Payer: Medicare HMO | Source: Ambulatory Visit | Attending: Internal Medicine | Admitting: Internal Medicine

## 2013-11-04 DIAGNOSIS — R609 Edema, unspecified: Secondary | ICD-10-CM

## 2013-11-04 DIAGNOSIS — S92919A Unspecified fracture of unspecified toe(s), initial encounter for closed fracture: Secondary | ICD-10-CM | POA: Insufficient documentation

## 2013-11-04 DIAGNOSIS — X58XXXA Exposure to other specified factors, initial encounter: Secondary | ICD-10-CM | POA: Insufficient documentation

## 2013-11-04 DIAGNOSIS — T1490XA Injury, unspecified, initial encounter: Secondary | ICD-10-CM

## 2014-06-29 DIAGNOSIS — G43019 Migraine without aura, intractable, without status migrainosus: Secondary | ICD-10-CM | POA: Diagnosis not present

## 2014-06-29 DIAGNOSIS — R319 Hematuria, unspecified: Secondary | ICD-10-CM | POA: Diagnosis not present

## 2014-06-29 DIAGNOSIS — R300 Dysuria: Secondary | ICD-10-CM | POA: Diagnosis not present

## 2014-07-04 DIAGNOSIS — R319 Hematuria, unspecified: Secondary | ICD-10-CM | POA: Diagnosis not present

## 2014-07-26 DIAGNOSIS — Z6825 Body mass index (BMI) 25.0-25.9, adult: Secondary | ICD-10-CM | POA: Diagnosis not present

## 2014-07-26 DIAGNOSIS — G43019 Migraine without aura, intractable, without status migrainosus: Secondary | ICD-10-CM | POA: Diagnosis not present

## 2014-08-25 DIAGNOSIS — Z0100 Encounter for examination of eyes and vision without abnormal findings: Secondary | ICD-10-CM | POA: Diagnosis not present

## 2014-09-04 ENCOUNTER — Encounter (HOSPITAL_COMMUNITY): Payer: Self-pay | Admitting: Emergency Medicine

## 2014-09-04 ENCOUNTER — Emergency Department (HOSPITAL_COMMUNITY)
Admission: EM | Admit: 2014-09-04 | Discharge: 2014-09-04 | Disposition: A | Discharge status: Home / Self Care | Payer: Commercial Managed Care - HMO | Attending: Emergency Medicine | Admitting: Emergency Medicine

## 2014-09-04 ENCOUNTER — Emergency Department (HOSPITAL_COMMUNITY): Payer: Commercial Managed Care - HMO

## 2014-09-04 DIAGNOSIS — Z79899 Other long term (current) drug therapy: Secondary | ICD-10-CM | POA: Insufficient documentation

## 2014-09-04 DIAGNOSIS — G43909 Migraine, unspecified, not intractable, without status migrainosus: Secondary | ICD-10-CM | POA: Insufficient documentation

## 2014-09-04 DIAGNOSIS — E876 Hypokalemia: Secondary | ICD-10-CM

## 2014-09-04 DIAGNOSIS — Z7982 Long term (current) use of aspirin: Secondary | ICD-10-CM | POA: Diagnosis not present

## 2014-09-04 DIAGNOSIS — R9431 Abnormal electrocardiogram [ECG] [EKG]: Secondary | ICD-10-CM | POA: Diagnosis not present

## 2014-09-04 DIAGNOSIS — J40 Bronchitis, not specified as acute or chronic: Secondary | ICD-10-CM | POA: Diagnosis not present

## 2014-09-04 DIAGNOSIS — R509 Fever, unspecified: Secondary | ICD-10-CM

## 2014-09-04 DIAGNOSIS — K219 Gastro-esophageal reflux disease without esophagitis: Secondary | ICD-10-CM | POA: Diagnosis not present

## 2014-09-04 DIAGNOSIS — I10 Essential (primary) hypertension: Secondary | ICD-10-CM | POA: Diagnosis not present

## 2014-09-04 DIAGNOSIS — R05 Cough: Secondary | ICD-10-CM | POA: Diagnosis not present

## 2014-09-04 DIAGNOSIS — Z7951 Long term (current) use of inhaled steroids: Secondary | ICD-10-CM | POA: Insufficient documentation

## 2014-09-04 DIAGNOSIS — J209 Acute bronchitis, unspecified: Secondary | ICD-10-CM | POA: Insufficient documentation

## 2014-09-04 LAB — CBC WITH DIFFERENTIAL/PLATELET
BASOS PCT: 1 % (ref 0–1)
Basophils Absolute: 0.1 10*3/uL (ref 0.0–0.1)
EOS ABS: 0.2 10*3/uL (ref 0.0–0.7)
EOS PCT: 2 % (ref 0–5)
HEMATOCRIT: 42.5 % (ref 36.0–46.0)
HEMOGLOBIN: 15.2 g/dL — AB (ref 12.0–15.0)
Lymphocytes Relative: 34 % (ref 12–46)
Lymphs Abs: 2.9 10*3/uL (ref 0.7–4.0)
MCH: 31.9 pg (ref 26.0–34.0)
MCHC: 35.8 g/dL (ref 30.0–36.0)
MCV: 89.3 fL (ref 78.0–100.0)
Monocytes Absolute: 0.4 10*3/uL (ref 0.1–1.0)
Monocytes Relative: 5 % (ref 3–12)
Neutro Abs: 4.8 10*3/uL (ref 1.7–7.7)
Neutrophils Relative %: 58 % (ref 43–77)
Platelets: 234 10*3/uL (ref 150–400)
RBC: 4.76 MIL/uL (ref 3.87–5.11)
RDW: 13.5 % (ref 11.5–15.5)
WBC: 8.3 10*3/uL (ref 4.0–10.5)

## 2014-09-04 LAB — BASIC METABOLIC PANEL
Anion gap: 9 (ref 5–15)
BUN: 11 mg/dL (ref 6–23)
CALCIUM: 9.2 mg/dL (ref 8.4–10.5)
CO2: 31 mmol/L (ref 19–32)
CREATININE: 0.78 mg/dL (ref 0.50–1.10)
Chloride: 102 mmol/L (ref 96–112)
GFR calc non Af Amer: 85 mL/min — ABNORMAL LOW (ref >90)
GLUCOSE: 110 mg/dL — AB (ref 70–99)
POTASSIUM: 2.6 mmol/L — AB (ref 3.5–5.1)
Sodium: 142 mmol/L (ref 135–145)

## 2014-09-04 LAB — MAGNESIUM: Magnesium: 2.1 mg/dL (ref 1.5–2.5)

## 2014-09-04 MED ORDER — POTASSIUM CHLORIDE CRYS ER 20 MEQ PO TBCR
40.0000 meq | EXTENDED_RELEASE_TABLET | Freq: Once | ORAL | Status: AC
  Administered 2014-09-04: 40 meq via ORAL
  Filled 2014-09-04: qty 2

## 2014-09-04 MED ORDER — LEVOFLOXACIN 750 MG PO TABS: 750.0000 mg | ORAL_TABLET | Freq: Every day | ORAL | Status: DC

## 2014-09-04 MED ORDER — POTASSIUM CHLORIDE 10 MEQ/100ML IV SOLN
10.0000 meq | INTRAVENOUS | Status: AC
  Administered 2014-09-04 (×2): 10 meq via INTRAVENOUS
  Filled 2014-09-04 (×2): qty 100

## 2014-09-04 MED ORDER — HYDROCODONE-HOMATROPINE 5-1.5 MG/5ML PO SYRP: 5.0000 mL | ORAL_SOLUTION | Freq: Four times a day (QID) | ORAL | Status: DC | PRN

## 2014-09-04 MED ORDER — ALBUTEROL SULFATE HFA 108 (90 BASE) MCG/ACT IN AERS: 2.0000 | INHALATION_SPRAY | RESPIRATORY_TRACT | Status: DC | PRN

## 2014-09-04 NOTE — ED Notes (Signed)
Having cold symptoms since last Sunday.  Temp 101 at home.  No medications taken today.

## 2014-09-04 NOTE — Discharge Instructions (Signed)
Hypokalemia Hypokalemia means that the amount of potassium in the blood is lower than normal.Potassium is a chemical, called an electrolyte, that helps regulate the amount of fluid in the body. It also stimulates muscle contraction and helps nerves function properly.Most of the body's potassium is inside of cells, and only a very small amount is in the blood. Because the amount in the blood is so small, minor changes can be life-threatening. CAUSES  Antibiotics.  Diarrhea or vomiting.  Using laxatives too much, which can cause diarrhea.  Chronic kidney disease.  Water pills (diuretics).  Eating disorders (bulimia).  Low magnesium level.  Sweating a lot. SIGNS AND SYMPTOMS  Weakness.  Constipation.  Fatigue.  Muscle cramps.  Mental confusion.  Skipped heartbeats or irregular heartbeat (palpitations).  Tingling or numbness. DIAGNOSIS  Your health care provider can diagnose hypokalemia with blood tests. In addition to checking your potassium level, your health care provider may also check other lab tests. TREATMENT Hypokalemia can be treated with potassium supplements taken by mouth or adjustments in your current medicines. If your potassium level is very low, you may need to get potassium through a vein (IV) and be monitored in the hospital. A diet high in potassium is also helpful. Foods high in potassium are:  Nuts, such as peanuts and pistachios.  Seeds, such as sunflower seeds and pumpkin seeds.  Peas, lentils, and lima beans.  Whole grain and bran cereals and breads.  Fresh fruit and vegetables, such as apricots, avocado, bananas, cantaloupe, kiwi, oranges, tomatoes, asparagus, and potatoes.  Orange and tomato juices.  Red meats.  Fruit yogurt. HOME CARE INSTRUCTIONS  Take all medicines as prescribed by your health care provider.  Maintain a healthy diet by including nutritious food, such as fruits, vegetables, nuts, whole grains, and lean meats.  If  you are taking a laxative, be sure to follow the directions on the label. SEEK MEDICAL CARE IF:  Your weakness gets worse.  You feel your heart pounding or racing.  You are vomiting or having diarrhea.  You are diabetic and having trouble keeping your blood glucose in the normal range. SEEK IMMEDIATE MEDICAL CARE IF:  You have chest pain, shortness of breath, or dizziness.  You are vomiting or having diarrhea for more than 2 days.  You faint. MAKE SURE YOU:   Understand these instructions.  Will watch your condition.  Will get help right away if you are not doing well or get worse. Document Released: 06/03/2005 Document Revised: 03/24/2013 Document Reviewed: 12/04/2012 [facility] Patient Information 2015 [place], [place]. This information is not intended to replace advice given to you by your health care provider. Make sure you discuss any questions you have with your health care provider. Acute Bronchitis Bronchitis is inflammation of the airways that extend from the windpipe into the lungs (bronchi). The inflammation often causes mucus to develop. This leads to a cough, which is the most common symptom of bronchitis.  In acute bronchitis, the condition usually develops suddenly and goes away over time, usually in a couple weeks. Smoking, allergies, and asthma can make bronchitis worse. Repeated episodes of bronchitis may cause further lung problems.  CAUSES Acute bronchitis is most often caused by the same virus that causes a cold. The virus can spread from person to person (contagious) through coughing, sneezing, and touching contaminated objects. SIGNS AND SYMPTOMS   Cough.   Fever.   Coughing up mucus.   Body aches.   Chest congestion.   Chills.   Shortness of  breath.   Sore throat.  DIAGNOSIS  Acute bronchitis is usually diagnosed through a physical exam. Your health care provider will also ask you questions about your medical history. Tests, such as chest  X-rays, are sometimes done to rule out other conditions.  TREATMENT  Acute bronchitis usually goes away in a couple weeks. Oftentimes, no medical treatment is necessary. Medicines are sometimes given for relief of fever or cough. Antibiotic medicines are usually not needed but may be prescribed in certain situations. In some cases, an inhaler may be recommended to help reduce shortness of breath and control the cough. A cool mist vaporizer may also be used to help thin bronchial secretions and make it easier to clear the chest.  HOME CARE INSTRUCTIONS  Get plenty of rest.   Drink enough fluids to keep your urine clear or pale yellow (unless you have a medical condition that requires fluid restriction). Increasing fluids may help thin your respiratory secretions (sputum) and reduce chest congestion, and it will prevent dehydration.   Take medicines only as directed by your health care provider.  If you were prescribed an antibiotic medicine, finish it all even if you start to feel better.  Avoid smoking and secondhand smoke. Exposure to cigarette smoke or irritating chemicals will make bronchitis worse. If you are a smoker, consider using nicotine gum or skin patches to help control withdrawal symptoms. Quitting smoking will help your lungs heal faster.   Reduce the chances of another bout of acute bronchitis by washing your hands frequently, avoiding people with cold symptoms, and trying not to touch your hands to your mouth, nose, or eyes.   Keep all follow-up visits as directed by your health care provider.  SEEK MEDICAL CARE IF: Your symptoms do not improve after 1 week of treatment.  SEEK IMMEDIATE MEDICAL CARE IF:  You develop an increased fever or chills.   You have chest pain.   You have severe shortness of breath.  You have bloody sputum.   You develop dehydration.  You faint or repeatedly feel like you are going to pass out.  You develop repeated vomiting.  You  develop a severe headache. MAKE SURE YOU:   Understand these instructions.  Will watch your condition.  Will get help right away if you are not doing well or get worse. Document Released: 07/11/2004 Document Revised: 10/18/2013 Document Reviewed: 11/24/2012 [facility] Patient Information 2015 [place], [place]. This information is not intended to replace advice given to you by your health care provider. Make sure you discuss any questions you have with your health care provider.

## 2014-09-04 NOTE — ED Notes (Addendum)
CRITICAL VALUE ALERT  Critical value received:  k- 2.6  Date of notification:  09/04/2014  Time of notification:  1512  Critical value read back:Yes.    Nurse who received alert:  Iona Coach  MD notified (1st page):  Idol and Rancour  Time MD responded: [id number]

## 2014-09-04 NOTE — ED Provider Notes (Signed)
CSN: [id number]     Arrival date & time 09/04/14  1328 History   First MD Initiated Contact with Patient 09/04/14 1516     Chief Complaint  Patient presents with  . Cough  . Fever     (Consider location/radiation/quality/duration/timing/severity/associated sxs/prior Treatment) HPI Comments: Patient presents to the ER for evaluation of cough and fever. Symptoms have been ongoing for one week. She reports persistent cough that is mostly nonproductive. Temperature has been as high as 101 at home. She has not been able to see her doctor. She does not feel short of breath and there is no chest pain. No associated vomiting or diarrhea.  Patient is a 69 y.o. female presenting with cough and fever.  Cough Associated symptoms: fever   Fever Associated symptoms: cough     Past Medical History  Diagnosis Date  . Migraine   . Hypertension   . GERD (gastroesophageal reflux disease)   . Chronic sinusitis    History reviewed. No pertinent past surgical history. History reviewed. No pertinent family history. History  Substance Use Topics  . Smoking status: Never Smoker   . Smokeless tobacco: Not on file  . Alcohol Use: No   OB History    No data available     Review of Systems  Constitutional: Positive for fever.  Respiratory: Positive for cough.   All other systems reviewed and are negative.     Allergies  Review of patient's allergies indicates no known allergies.  Home Medications   Prior to Admission medications   Medication Sig Start Date End Date Taking? Authorizing Provider  aspirin EC 81 MG tablet Take 81 mg by mouth daily.   Yes Historical Provider, MD  Biotin 5000 MCG TABS Take 1 tablet by mouth daily.   Yes Historical Provider, MD  dextromethorphan (DELSYM) 30 MG/5ML liquid Take 30 mg by mouth as needed for cough.   Yes Historical Provider, MD  esomeprazole (NEXIUM) 40 MG capsule Take 40 mg by mouth daily before breakfast.   Yes Historical Provider, MD  fish  oil-omega-3 fatty acids 1000 MG capsule Take 1 g by mouth daily.   Yes Historical Provider, MD  fluticasone (FLONASE) 50 MCG/ACT nasal spray Place 2 sprays into the nose daily.   Yes Historical Provider, MD  gabapentin (NEURONTIN) 300 MG capsule Take 1 capsule by mouth 3 (three) times daily. 07/20/14  Yes Historical Provider, MD  guaiFENesin (MUCINEX) 600 MG 12 hr tablet Take 600 mg by mouth 2 (two) times daily as needed for cough.   Yes Historical Provider, MD  hydrochlorothiazide (HYDRODIURIL) 25 MG tablet Take 1 tablet by mouth daily. 07/21/14  Yes Historical Provider, MD  Multiple Vitamin (MULITIVITAMIN WITH MINERALS) TABS Take 1 tablet by mouth daily.   Yes Historical Provider, MD  naproxen sodium (ALEVE) 220 MG tablet Take 220 mg by mouth 2 (two) times daily as needed. Pain   Yes Historical Provider, MD  propranolol (INDERAL) 80 MG tablet Take 0.5 tablets (40 mg total) by mouth 2 (two) times daily. 06/30/11  Yes Kathie Dike, MD  topiramate (TOPAMAX) 25 MG tablet Take 1 tablet by mouth 2 (two) times daily. 07/21/14  Yes Historical Provider, MD   BP 121/85 mmHg  Pulse 54  Temp(Src) 98 F (36.7 C) (Oral)  Resp 18  Ht 5\' 8"  (1.727 m)  Wt 135 lb (61.236 kg)  BMI 20.53 kg/m2  SpO2 92% Physical Exam  Constitutional: She is oriented to person, place, and time. She appears well-developed and well-nourished.  No distress.  HENT:  Head: Normocephalic and atraumatic.  Right Ear: Hearing normal.  Left Ear: Hearing normal.  Nose: Nose normal.  Mouth/Throat: Oropharynx is clear and moist and mucous membranes are normal.  Eyes: Conjunctivae and EOM are normal. Pupils are equal, round, and reactive to light.  Neck: Normal range of motion. Neck supple.  Cardiovascular: Regular rhythm, S1 normal and S2 normal.  Exam reveals no gallop and no friction rub.   No murmur heard. Pulmonary/Chest: Effort normal and breath sounds normal. No respiratory distress. She exhibits no tenderness.  Abdominal: Soft.  Normal appearance and bowel sounds are normal. There is no hepatosplenomegaly. There is no tenderness. There is no rebound, no guarding, no tenderness at McBurney's point and negative Murphy's sign. No hernia.  Musculoskeletal: Normal range of motion.  Neurological: She is alert and oriented to person, place, and time. She has normal strength. No cranial nerve deficit or sensory deficit. Coordination normal. GCS eye subscore is 4. GCS verbal subscore is 5. GCS motor subscore is 6.  Skin: Skin is warm, dry and intact. No rash noted. No cyanosis.  Psychiatric: She has a normal mood and affect. Her speech is normal and behavior is normal. Thought content normal.  Nursing note and vitals reviewed.   ED Course  Procedures (including critical care time) Labs Review Labs Reviewed  CBC WITH DIFFERENTIAL/PLATELET - Abnormal; Notable for the following:    Hemoglobin 15.2 (*)    All other components within normal limits  BASIC METABOLIC PANEL - Abnormal; Notable for the following:    Potassium 2.6 (*)    Glucose, Bld 110 (*)    GFR calc non Af Amer 85 (*)    All other components within normal limits  MAGNESIUM    Imaging Review Dg Chest 2 View  09/04/2014   CLINICAL DATA:  Cough, fever x1 week  EXAM: CHEST  2 VIEW  COMPARISON:  06/28/2011  FINDINGS: Lungs are clear.  No pleural effusion or pneumothorax.  The heart is normal in size.  Mild degenerative changes of the visualized thoracolumbar spine.  Mild anterior wedging of a mid thoracic vertebral body.  IMPRESSION: No evidence of acute cardiopulmonary disease.   Electronically Signed   By: Julian Hy M.D.   On: 09/04/2014 15:38     EKG Interpretation   Date/Time:  Sunday September 04 2014 15:26:40 EDT Ventricular Rate:  50 PR Interval:  201 QRS Duration: 104 QT Interval:  471 QTC Calculation: 429 R Axis:   -12 Text Interpretation:  Sinus rhythm Borderline T abnormalities, anterior  leads No significant change since last tracing  Confirmed by POLLINA  MD,  CHRISTOPHER [phone]) on 09/04/2014 3:53:15 PM      MDM   Final diagnoses:  Fever, unspecified fever cause  Bronchitis  Hypokalemia   Patient presents to the ER for evaluation of one week of fever, cough. Patient does not have any history of COPD or asthma. There has not been associated vomiting or diarrhea. Patient appears well. She is afebrile here in the ER. Vital signs are normal except for slightly diminished oxygen saturations but not hypoxic. Chest x-ray is clear, no evidence of pneumonia. Cannot rule out influenza, but has had one week of symptoms, Tamiflu not indicated. After one week of persistent fever and cough, will treat with antibiotic coverage empirically.  Patient also treated with nebulizer treatment here in the ER will continue cough medicine and dilator therapy. Patient was found to be hypokalemic here in the ER. She was  given potassium here in the ER and will continue a 3 day course at home, follow-up with primary doctor.    Orpah Greek, MD 09/04/14 [phone]

## 2014-09-08 DIAGNOSIS — R51 Headache: Secondary | ICD-10-CM | POA: Diagnosis not present

## 2014-09-08 DIAGNOSIS — J069 Acute upper respiratory infection, unspecified: Secondary | ICD-10-CM | POA: Diagnosis not present

## 2014-09-08 DIAGNOSIS — E876 Hypokalemia: Secondary | ICD-10-CM | POA: Diagnosis not present

## 2014-09-08 DIAGNOSIS — Z6825 Body mass index (BMI) 25.0-25.9, adult: Secondary | ICD-10-CM | POA: Diagnosis not present

## 2014-11-11 DIAGNOSIS — M5430 Sciatica, unspecified side: Secondary | ICD-10-CM | POA: Diagnosis not present

## 2014-11-24 DIAGNOSIS — H65192 Other acute nonsuppurative otitis media, left ear: Secondary | ICD-10-CM | POA: Diagnosis not present

## 2014-12-06 DIAGNOSIS — H9209 Otalgia, unspecified ear: Secondary | ICD-10-CM | POA: Diagnosis not present

## 2015-01-26 ENCOUNTER — Ambulatory Visit (INDEPENDENT_AMBULATORY_CARE_PROVIDER_SITE_OTHER): Payer: Commercial Managed Care - HMO | Admitting: Otolaryngology

## 2015-01-26 DIAGNOSIS — K219 Gastro-esophageal reflux disease without esophagitis: Secondary | ICD-10-CM

## 2015-01-26 DIAGNOSIS — R490 Dysphonia: Secondary | ICD-10-CM | POA: Diagnosis not present

## 2015-01-26 DIAGNOSIS — H6983 Other specified disorders of Eustachian tube, bilateral: Secondary | ICD-10-CM | POA: Diagnosis not present

## 2015-01-26 DIAGNOSIS — H903 Sensorineural hearing loss, bilateral: Secondary | ICD-10-CM

## 2015-02-03 DIAGNOSIS — H9202 Otalgia, left ear: Secondary | ICD-10-CM | POA: Diagnosis not present

## 2015-02-06 DIAGNOSIS — H9209 Otalgia, unspecified ear: Secondary | ICD-10-CM | POA: Diagnosis not present

## 2015-02-06 DIAGNOSIS — R05 Cough: Secondary | ICD-10-CM | POA: Diagnosis not present

## 2015-02-06 DIAGNOSIS — J040 Acute laryngitis: Secondary | ICD-10-CM | POA: Diagnosis not present

## 2015-02-13 DIAGNOSIS — J069 Acute upper respiratory infection, unspecified: Secondary | ICD-10-CM | POA: Diagnosis not present

## 2015-02-23 ENCOUNTER — Ambulatory Visit (INDEPENDENT_AMBULATORY_CARE_PROVIDER_SITE_OTHER): Payer: Commercial Managed Care - HMO | Admitting: Otolaryngology

## 2015-02-23 DIAGNOSIS — H6983 Other specified disorders of Eustachian tube, bilateral: Secondary | ICD-10-CM | POA: Diagnosis not present

## 2015-04-06 DIAGNOSIS — E119 Type 2 diabetes mellitus without complications: Secondary | ICD-10-CM | POA: Diagnosis not present

## 2015-04-12 DIAGNOSIS — E876 Hypokalemia: Secondary | ICD-10-CM | POA: Diagnosis not present

## 2015-04-12 DIAGNOSIS — J302 Other seasonal allergic rhinitis: Secondary | ICD-10-CM | POA: Diagnosis not present

## 2015-04-12 DIAGNOSIS — G43019 Migraine without aura, intractable, without status migrainosus: Secondary | ICD-10-CM | POA: Diagnosis not present

## 2015-04-12 DIAGNOSIS — Z23 Encounter for immunization: Secondary | ICD-10-CM | POA: Diagnosis not present

## 2015-04-12 DIAGNOSIS — K219 Gastro-esophageal reflux disease without esophagitis: Secondary | ICD-10-CM | POA: Diagnosis not present

## 2015-04-12 DIAGNOSIS — E782 Mixed hyperlipidemia: Secondary | ICD-10-CM | POA: Diagnosis not present

## 2015-05-03 DIAGNOSIS — J00 Acute nasopharyngitis [common cold]: Secondary | ICD-10-CM | POA: Diagnosis not present

## 2015-07-04 DIAGNOSIS — R51 Headache: Secondary | ICD-10-CM | POA: Diagnosis not present

## 2015-07-18 DIAGNOSIS — H6120 Impacted cerumen, unspecified ear: Secondary | ICD-10-CM | POA: Diagnosis not present

## 2015-07-18 DIAGNOSIS — H8110 Benign paroxysmal vertigo, unspecified ear: Secondary | ICD-10-CM | POA: Diagnosis not present

## 2015-10-11 DIAGNOSIS — E119 Type 2 diabetes mellitus without complications: Secondary | ICD-10-CM | POA: Diagnosis not present

## 2015-10-13 DIAGNOSIS — G43019 Migraine without aura, intractable, without status migrainosus: Secondary | ICD-10-CM | POA: Diagnosis not present

## 2015-10-13 DIAGNOSIS — K219 Gastro-esophageal reflux disease without esophagitis: Secondary | ICD-10-CM | POA: Diagnosis not present

## 2015-10-13 DIAGNOSIS — I10 Essential (primary) hypertension: Secondary | ICD-10-CM | POA: Diagnosis not present

## 2015-10-13 DIAGNOSIS — E876 Hypokalemia: Secondary | ICD-10-CM | POA: Diagnosis not present

## 2015-10-18 DIAGNOSIS — R197 Diarrhea, unspecified: Secondary | ICD-10-CM | POA: Diagnosis not present

## 2015-10-18 DIAGNOSIS — R112 Nausea with vomiting, unspecified: Secondary | ICD-10-CM | POA: Diagnosis not present

## 2015-11-07 ENCOUNTER — Encounter (INDEPENDENT_AMBULATORY_CARE_PROVIDER_SITE_OTHER): Payer: Self-pay | Admitting: *Deleted

## 2015-11-30 ENCOUNTER — Encounter (INDEPENDENT_AMBULATORY_CARE_PROVIDER_SITE_OTHER): Payer: Self-pay | Admitting: *Deleted

## 2015-11-30 ENCOUNTER — Encounter (INDEPENDENT_AMBULATORY_CARE_PROVIDER_SITE_OTHER): Payer: Self-pay

## 2015-11-30 ENCOUNTER — Encounter (INDEPENDENT_AMBULATORY_CARE_PROVIDER_SITE_OTHER): Payer: Self-pay | Admitting: Internal Medicine

## 2015-11-30 ENCOUNTER — Ambulatory Visit (INDEPENDENT_AMBULATORY_CARE_PROVIDER_SITE_OTHER): Payer: Commercial Managed Care - HMO | Admitting: Internal Medicine

## 2015-11-30 ENCOUNTER — Other Ambulatory Visit (INDEPENDENT_AMBULATORY_CARE_PROVIDER_SITE_OTHER): Payer: Self-pay | Admitting: *Deleted

## 2015-11-30 ENCOUNTER — Other Ambulatory Visit (INDEPENDENT_AMBULATORY_CARE_PROVIDER_SITE_OTHER): Payer: Self-pay | Admitting: Internal Medicine

## 2015-11-30 VITALS — BP 114/70 | HR 60 | Temp 98.3°F | Ht 64.5 in | Wt 137.5 lb

## 2015-11-30 DIAGNOSIS — K5732 Diverticulitis of large intestine without perforation or abscess without bleeding: Secondary | ICD-10-CM | POA: Diagnosis not present

## 2015-11-30 DIAGNOSIS — R1012 Left upper quadrant pain: Secondary | ICD-10-CM

## 2015-11-30 DIAGNOSIS — K219 Gastro-esophageal reflux disease without esophagitis: Secondary | ICD-10-CM | POA: Diagnosis not present

## 2015-11-30 DIAGNOSIS — R1032 Left lower quadrant pain: Secondary | ICD-10-CM

## 2015-11-30 MED ORDER — PANTOPRAZOLE SODIUM 40 MG PO TBEC: 40.0000 mg | DELAYED_RELEASE_TABLET | Freq: Every day | ORAL | Status: DC

## 2015-11-30 MED ORDER — PEG 3350-KCL-NA BICARB-NACL 420 G PO SOLR: 4000.0000 mL | Freq: Once | ORAL | Status: DC

## 2015-11-30 NOTE — Telephone Encounter (Signed)
Patient needs trilyte 

## 2015-11-30 NOTE — Progress Notes (Signed)
Subjective:    Patient ID: Judy Watts, female    DOB: [birth year], 70 y.o.   MRN: [id number]  HPI Referred by Dr. Merlyn Albert for nausea/diarrhea. She has seen Dr. Nevada Crane for same and actually had LLQ pain. She had symptoms x 10 days in May. She was treated with Cipro and Flagyl. She also had fever and chills that were associated with her symptoms. She says she has hx of diverticulitis but this is not documented on CT.   She was treated empirically for diverticulitis.  She tells me she feels better. She says she has pain in her epigastric region when she eats. She also points to her LLQ.  Symptoms since May.  She denies any acid reflux.  Appetite is not good because she knows she will have the pain.  She has a BM daily. No melena or BRRB.  She says she has lost 30-40 pounds over the last 1 1/2 yrs which she says was unintentional.  Hx of colon cancer in a niece in her 51s. Her last colonoscopy was greater than 10 yrs ago at [facility].     10/12/2015 total bili 0.6, ALP 52, AST 16, ALT 13, Total protein 6.4, Albumin 3.7, H and H 13.1 and 37.6, MCV 91.9, platelet ct 206.    Review of Systems Past Medical History  Diagnosis Date  . Migraine   . Hypertension   . GERD (gastroesophageal reflux disease)   . Chronic sinusitis     Past Surgical History  Procedure Laterality Date  . Tubal ligation      No Known Allergies  Current Outpatient Prescriptions on File Prior to Visit  Medication Sig Dispense Refill  . fluticasone (FLONASE) 50 MCG/ACT nasal spray Place 2 sprays into the nose daily.    Marland Kitchen gabapentin (NEURONTIN) 300 MG capsule Take 1 capsule by mouth 3 (three) times daily.    . hydrochlorothiazide (HYDRODIURIL) 25 MG tablet Take 1 tablet by mouth daily.    . propranolol (INDERAL) 80 MG tablet Take 0.5 tablets (40 mg total) by mouth 2 (two) times daily. 60 tablet 0  . topiramate (TOPAMAX) 25 MG tablet Take 1 tablet by mouth 2 (two) times daily.    Marland Kitchen albuterol (PROVENTIL HFA;VENTOLIN HFA)  108 (90 BASE) MCG/ACT inhaler Inhale 2 puffs into the lungs every 4 (four) hours as needed for wheezing or shortness of breath. (Patient not taking: Reported on 11/30/2015) 1 Inhaler 0  . aspirin EC 81 MG tablet Take 81 mg by mouth daily. Reported on 11/30/2015    . Biotin 5000 MCG TABS Take 1 tablet by mouth daily. Reported on 11/30/2015    . dextromethorphan (DELSYM) 30 MG/5ML liquid Take 30 mg by mouth as needed for cough. Reported on 11/30/2015    . esomeprazole (NEXIUM) 40 MG capsule Take 40 mg by mouth daily before breakfast. Reported on 11/30/2015    . fish oil-omega-3 fatty acids 1000 MG capsule Take 1 g by mouth daily. Reported on 11/30/2015    . guaiFENesin (MUCINEX) 600 MG 12 hr tablet Take 600 mg by mouth 2 (two) times daily as needed for cough. Reported on 11/30/2015    . HYDROcodone-homatropine (HYCODAN) 5-1.5 MG/5ML syrup Take 5 mLs by mouth every 6 (six) hours as needed for cough. (Patient not taking: Reported on 11/30/2015) 75 mL 0  . levofloxacin (LEVAQUIN) 750 MG tablet Take 1 tablet (750 mg total) by mouth daily. (Patient not taking: Reported on 11/30/2015) 5 tablet 0  . Multiple Vitamin (MULITIVITAMIN  WITH MINERALS) TABS Take 1 tablet by mouth daily. Reported on 11/30/2015    . naproxen sodium (ALEVE) 220 MG tablet Take 220 mg by mouth 2 (two) times daily as needed. Reported on 11/30/2015     No current facility-administered medications on file prior to visit.        Objective:   Physical ExamBlood pressure 114/70, pulse 60, temperature 98.3 F (36.8 C), height 5' 4.5" (1.638 m), weight 137 lb 8 oz (62.37 kg).  Alert and oriented. Skin warm and dry. Oral mucosa is moist.   . Sclera anicteric, conjunctivae is pink. Thyroid not enlarged. No cervical lymphadenopathy. Lungs clear. Heart regular rate and rhythm.  Abdomen is soft. Bowel sounds are positive. No hepatomegaly. No abdominal masses felt. Tenderness LLQ and epigastric region.   No edema to lower extremities.  Tattoo LLQ        Assessment & Plan:  GERD not controlled at this time. Will send Rx for Protonix to her drug store. She needs an EGD. Recent hx of diverticulitis: Her last colonoscopy was about 10 yrs ago. Colonoscopy.  The risks and benefits such as perforation, bleeding, and infection were reviewed with the patient and is agreeable.

## 2015-11-30 NOTE — Patient Instructions (Signed)
The risks and benefits such as perforation, bleeding, and infection were reviewed with the patient and is agreeable. 

## 2016-02-01 ENCOUNTER — Encounter (HOSPITAL_COMMUNITY): Payer: Self-pay | Admitting: *Deleted

## 2016-02-01 ENCOUNTER — Encounter (HOSPITAL_COMMUNITY)
Admission: RE | Disposition: A | Discharge status: Home / Self Care | Payer: Self-pay | Source: Ambulatory Visit | Attending: Internal Medicine

## 2016-02-01 ENCOUNTER — Ambulatory Visit (HOSPITAL_COMMUNITY)
Admission: RE | Admit: 2016-02-01 | Discharge: 2016-02-01 | Disposition: A | Discharge status: Home / Self Care | Payer: Commercial Managed Care - HMO | Source: Ambulatory Visit | Attending: Internal Medicine | Admitting: Internal Medicine

## 2016-02-01 DIAGNOSIS — Z79899 Other long term (current) drug therapy: Secondary | ICD-10-CM | POA: Diagnosis not present

## 2016-02-01 DIAGNOSIS — K228 Other specified diseases of esophagus: Secondary | ICD-10-CM | POA: Diagnosis not present

## 2016-02-01 DIAGNOSIS — I10 Essential (primary) hypertension: Secondary | ICD-10-CM | POA: Diagnosis not present

## 2016-02-01 DIAGNOSIS — K219 Gastro-esophageal reflux disease without esophagitis: Secondary | ICD-10-CM | POA: Insufficient documentation

## 2016-02-01 DIAGNOSIS — K648 Other hemorrhoids: Secondary | ICD-10-CM | POA: Diagnosis not present

## 2016-02-01 DIAGNOSIS — Z7951 Long term (current) use of inhaled steroids: Secondary | ICD-10-CM | POA: Insufficient documentation

## 2016-02-01 DIAGNOSIS — K5730 Diverticulosis of large intestine without perforation or abscess without bleeding: Secondary | ICD-10-CM | POA: Insufficient documentation

## 2016-02-01 DIAGNOSIS — R1032 Left lower quadrant pain: Secondary | ICD-10-CM

## 2016-02-01 DIAGNOSIS — Z7982 Long term (current) use of aspirin: Secondary | ICD-10-CM | POA: Diagnosis not present

## 2016-02-01 DIAGNOSIS — K644 Residual hemorrhoidal skin tags: Secondary | ICD-10-CM | POA: Insufficient documentation

## 2016-02-01 DIAGNOSIS — K5732 Diverticulitis of large intestine without perforation or abscess without bleeding: Secondary | ICD-10-CM

## 2016-02-01 DIAGNOSIS — Z1211 Encounter for screening for malignant neoplasm of colon: Secondary | ICD-10-CM | POA: Diagnosis not present

## 2016-02-01 DIAGNOSIS — K210 Gastro-esophageal reflux disease with esophagitis: Secondary | ICD-10-CM | POA: Diagnosis not present

## 2016-02-01 HISTORY — PX: COLONOSCOPY: SHX5424

## 2016-02-01 HISTORY — PX: ESOPHAGOGASTRODUODENOSCOPY: SHX5428

## 2016-02-01 HISTORY — DX: Allergy status to unspecified drugs, medicaments and biological substances: Z88.9

## 2016-02-01 SURGERY — EGD (ESOPHAGOGASTRODUODENOSCOPY)
Anesthesia: Moderate Sedation

## 2016-02-01 MED ORDER — MIDAZOLAM HCL 5 MG/5ML IJ SOLN
INTRAMUSCULAR | Status: AC
  Filled 2016-02-01: qty 10

## 2016-02-01 MED ORDER — MIDAZOLAM HCL 5 MG/5ML IJ SOLN
INTRAMUSCULAR | Status: DC | PRN
  Administered 2016-02-01: 1 mg via INTRAVENOUS
  Administered 2016-02-01: 2 mg via INTRAVENOUS
  Administered 2016-02-01: 1 mg via INTRAVENOUS
  Administered 2016-02-01: 2 mg via INTRAVENOUS
  Administered 2016-02-01: 1 mg via INTRAVENOUS

## 2016-02-01 MED ORDER — MEPERIDINE HCL 50 MG/ML IJ SOLN
INTRAMUSCULAR | Status: DC | PRN
  Administered 2016-02-01 (×2): 25 mg via INTRAVENOUS

## 2016-02-01 MED ORDER — BUTAMBEN-TETRACAINE-BENZOCAINE 2-2-14 % EX AERO
INHALATION_SPRAY | CUTANEOUS | Status: DC | PRN
  Administered 2016-02-01: 2 via TOPICAL

## 2016-02-01 MED ORDER — SODIUM CHLORIDE 0.9 % IV SOLN: INTRAVENOUS | Status: DC

## 2016-02-01 MED ORDER — PSYLLIUM 28 % PO PACK: 1.0000 | PACK | Freq: Every day | ORAL | Status: DC

## 2016-02-01 MED ORDER — HYDROCORTISONE ACETATE 25 MG RE SUPP: 25.0000 mg | Freq: Every day | RECTAL | 1 refills | Status: DC

## 2016-02-01 MED ORDER — MEPERIDINE HCL 50 MG/ML IJ SOLN
INTRAMUSCULAR | Status: AC
  Filled 2016-02-01: qty 1

## 2016-02-01 NOTE — H&P (Signed)
Judy Watts is an 70 y.o. female.   Chief Complaint: Patient is here for EGD and colonoscopy. HPI: 70 year old Caucasian female who has symptoms of GERD for more than 20 years. Symptoms relapse within few hours of stopping her missing medication. She denies nausea vomiting dysphagia. Last year and half she lost over 40 pounds presumed to be due to Topamax. She states weight loss has leveled off and she actually may have gained a few pounds back. She denies melena diarrhea or rectal bleeding. She has hemorrhoids and she has noted blood on the tissue sometimes. She says she was treated for diverticulitis about 3 months ago. Family history significant for CRC in her niece heart surgery at age 85 and is doing fine(sister's child). Last colonoscopy was 10 years ago.  Past Medical History:  Diagnosis Date  . Chronic sinusitis   . GERD (gastroesophageal reflux disease)   . H/O seasonal allergies   . Hypertension   . Migraine     Past Surgical History:  Procedure Laterality Date  . CERVIX SURGERY    . COLONOSCOPY    . TUBAL LIGATION      History reviewed. No pertinent family history. Social History:  reports that she has never smoked. She has never used smokeless tobacco. She reports that she does not drink alcohol or use drugs.  Allergies: No Known Allergies  Medications Prior to Admission  Medication Sig Dispense Refill  . albuterol (PROVENTIL HFA;VENTOLIN HFA) 108 (90 BASE) MCG/ACT inhaler Inhale 2 puffs into the lungs every 4 (four) hours as needed for wheezing or shortness of breath. 1 Inhaler 0  . esomeprazole (NEXIUM) 20 MG capsule Take 20 mg by mouth daily at 12 noon.    Marland Kitchen esomeprazole (NEXIUM) 40 MG capsule Take 40 mg by mouth daily before breakfast. Reported on 11/30/2015    . gabapentin (NEURONTIN) 300 MG capsule Take 1 capsule by mouth 3 (three) times daily.    . hydrochlorothiazide (HYDRODIURIL) 25 MG tablet Take 1 tablet by mouth daily.    Marland Kitchen HYDROcodone-acetaminophen  (NORCO/VICODIN) 5-325 MG tablet Take 1 tablet by mouth every 6 (six) hours as needed for moderate pain.    . montelukast (SINGULAIR) 10 MG tablet Take 10 mg by mouth at bedtime.    . Multiple Vitamin (MULITIVITAMIN WITH MINERALS) TABS Take 1 tablet by mouth daily. Reported on 11/30/2015    . polyethylene glycol-electrolytes (TRILYTE) 420 g solution Take 4,000 mLs by mouth once. 4000 mL 0  . potassium chloride (K-DUR) 10 MEQ tablet Take 10 mEq by mouth daily.    . propranolol (INDERAL) 80 MG tablet Take 0.5 tablets (40 mg total) by mouth 2 (two) times daily. 60 tablet 0  . topiramate (TOPAMAX) 25 MG tablet Take 1 tablet by mouth 2 (two) times daily.    Marland Kitchen aspirin 81 MG tablet Take 81 mg by mouth daily.    Marland Kitchen aspirin EC 81 MG tablet Take 81 mg by mouth daily. Reported on 11/30/2015    . Biotin 5000 MCG TABS Take 1 tablet by mouth daily. Reported on 11/30/2015    . dextromethorphan (DELSYM) 30 MG/5ML liquid Take 30 mg by mouth as needed for cough. Reported on 11/30/2015    . diclofenac (VOLTAREN) 75 MG EC tablet Take 75 mg by mouth 2 (two) times daily.    . fish oil-omega-3 fatty acids 1000 MG capsule Take 1 g by mouth daily. Reported on 11/30/2015    . fluticasone (FLONASE) 50 MCG/ACT nasal spray Place 2 sprays into  the nose daily.    Marland Kitchen guaiFENesin (MUCINEX) 600 MG 12 hr tablet Take 600 mg by mouth 2 (two) times daily as needed for cough. Reported on 11/30/2015    . HYDROcodone-homatropine (HYCODAN) 5-1.5 MG/5ML syrup Take 5 mLs by mouth every 6 (six) hours as needed for cough. (Patient not taking: Reported on 11/30/2015) 75 mL 0  . levofloxacin (LEVAQUIN) 750 MG tablet Take 1 tablet (750 mg total) by mouth daily. (Patient not taking: Reported on 11/30/2015) 5 tablet 0  . Multiple Vitamin (MULTIVITAMIN) tablet Take 1 tablet by mouth daily.    . naproxen sodium (ALEVE) 220 MG tablet Take 220 mg by mouth 2 (two) times daily as needed. Reported on 11/30/2015    . pantoprazole (PROTONIX) 40 MG tablet Take 1 tablet  (40 mg total) by mouth daily. 90 tablet 3    No results found for this or any previous visit (from the past 48 hour(s)). No results found.  ROS  Blood pressure (!) 149/98, pulse 75, temperature 98.5 F (36.9 C), temperature source Oral, resp. rate (!) 9, height 5\' 4"  (1.626 m), weight 132 lb (59.9 kg), SpO2 99 %. Physical Exam  Constitutional: She appears well-developed and well-nourished.  HENT:  Mouth/Throat: Oropharynx is clear and moist.  Eyes: Conjunctivae are normal. No scleral icterus.  Neck: No thyromegaly present.  Cardiovascular: Normal rate, regular rhythm and normal heart sounds.   No murmur heard. Respiratory: Effort normal and breath sounds normal.  GI: Soft. She exhibits no distension and no mass. There is no tenderness.  Musculoskeletal: She exhibits no edema.  Lymphadenopathy:    She has no cervical adenopathy.  Neurological: She is alert.  Skin: Skin is warm and dry.     Assessment/Plan Chronic GERD. Diagnostic EGD and average risk screening colonoscopy.  Hildred Laser, MD 02/01/2016, 12:42 PM

## 2016-02-01 NOTE — Op Note (Signed)
[facility] Patient Name: Judy Watts Procedure Date: 02/01/2016 1:03 PM MRN: [id number] Date of Birth: 07/15/1945 Attending MD: Hildred Laser , MD CSN: [id number] Age: 70 Admit Type: Outpatient Procedure:                Colonoscopy Indications:              Screening for colorectal malignant neoplasm Providers:                Hildred Laser, MD, Janeece Riggers, RN, Isabella Stalling,                            Technician Referring MD:             Delphina Cahill, MD Medicines:                Midazolam 2 mg IV Complications:            No immediate complications. Estimated Blood Loss:     Estimated blood loss: none. Procedure:                Pre-Anesthesia Assessment:                           - Prior to the procedure, a History and Physical                            was performed, and patient medications and                            allergies were reviewed. The patient's tolerance of                            previous anesthesia was also reviewed. The risks                            and benefits of the procedure and the sedation                            options and risks were discussed with the patient.                            All questions were answered, and informed consent                            was obtained. Prior Anticoagulants: The patient                            last took aspirin 2 days prior to the procedure.                            ASA Grade Assessment: III - A patient with severe                            systemic disease. After reviewing the risks and  benefits, the patient was deemed in satisfactory                            condition to undergo the procedure.                           After obtaining informed consent, the colonoscope                            was passed under direct vision. Throughout the                            procedure, the patient's blood pressure, pulse, and                            oxygen saturations  were monitored continuously. The                            [id number]) was introduced through the                            anus and advanced to the the cecum, identified by                            appendiceal orifice and ileocecal valve. The                            quality of the bowel preparation was adequate. The                            ileocecal valve, appendiceal orifice, and rectum                            were photographed. The colonoscopy was somewhat                            difficult due to a tortuous colon. Successful                            completion of the procedure was aided by applying                            abdominal pressure. The patient tolerated the                            procedure well. Scope In: 1:07:58 PM Scope Out: 1:28:17 PM Scope Withdrawal Time: 0 hours 7 minutes 24 seconds  Total Procedure Duration: 0 hours 20 minutes 19 seconds  Findings:      soft sentinel skin tag.      Multiple small and large-mouthed diverticula were found in the sigmoid       colon.      External internal hemorrhoids were found during retroflexion. The       hemorrhoids were medium-sized. Impression:               -  Diverticulosis in the sigmoid colon.                           - External internal hemorrhoids.                           - No specimens collected. Moderate Sedation:      Moderate (conscious) sedation was administered by the endoscopy nurse       and supervised by the endoscopist. The following parameters were       monitored: oxygen saturation, heart rate, blood pressure, CO2       capnography and response to care. Total physician intraservice time was       28 minutes. Recommendation:           - Patient has a contact number available for                            emergencies. The signs and symptoms of potential                            delayed complications were discussed with the                            patient. Return to  normal activities tomorrow.                            Written discharge instructions were provided to the                            patient.                           - High fiber diet today.                           - Continue present medications.                           - Use sugar-free Metamucil one tablespoon PO daily                            today.                           - Use hydrocortisone suppository 25 mg 1 per rectum                            once a day for 2 weeks.                           - Repeat colonoscopy in 10 years for screening                            purposes. Procedure Code(s):        --- Professional ---  [id number], Colonoscopy, flexible; diagnostic, including                            collection of specimen(s) by brushing or washing,                            when performed (separate procedure)                           99152, Moderate sedation services provided by the                            same physician or other qualified health care                            professional performing the diagnostic or                            therapeutic service that the sedation supports,                            requiring the presence of an independent trained                            observer to assist in the monitoring of the                            patient's level of consciousness and physiological                            status; initial 15 minutes of intraservice time,                            patient age 47 years or older                           [phone], Moderate sedation services; each additional                            15 minutes intraservice time Diagnosis Code(s):        --- Professional ---                           Z12.11, Encounter for screening for malignant                            neoplasm of colon                           K64.4, Residual hemorrhoidal skin tags                           K64.8, Other  hemorrhoids                           K57.30, Diverticulosis of large intestine  without                            perforation or abscess without bleeding CPT copyright 2016 [employer]. All rights reserved. The codes documented in this report are preliminary and upon coder review may  be revised to meet current compliance requirements. Hildred Laser, MD Hildred Laser, MD 02/01/2016 1:46:57 PM This report has been signed electronically. Number of Addenda: 0

## 2016-02-01 NOTE — Op Note (Signed)
[facility] Patient Name: Judy Watts Procedure Date: 02/01/2016 12:36 PM MRN: [id number] Date of Birth: Aug 08, 1945 Attending MD: Hildred Laser , MD CSN: [id number] Age: 70 Admit Type: Outpatient Procedure:                Upper GI endoscopy Indications:              Follow-up of reflux esophagitis Providers:                Hildred Laser, MD, Janeece Riggers, RN, Isabella Stalling,                            Technician Referring MD:             Delphina Cahill, MD Medicines:                Cetacaine spray, Meperidine 50 mg IV, Midazolam 4                            mg IV Complications:            No immediate complications. Estimated Blood Loss:     Estimated blood loss was minimal. Procedure:                Pre-Anesthesia Assessment:                           - Prior to the procedure, a History and Physical                            was performed, and patient medications and                            allergies were reviewed. The patient's tolerance of                            previous anesthesia was also reviewed. The risks                            and benefits of the procedure and the sedation                            options and risks were discussed with the patient.                            All questions were answered, and informed consent                            was obtained. Prior Anticoagulants: The patient                            last took aspirin 2 days prior to the procedure.                            ASA Grade Assessment: III - A patient with severe  systemic disease. After reviewing the risks and                            benefits, the patient was deemed in satisfactory                            condition to undergo the procedure.                           After obtaining informed consent, the endoscope was                            passed under direct vision. Throughout the                            procedure, the patient's blood  pressure, pulse, and                            oxygen saturations were monitored continuously. The                            [id number]) scope was introduced through the                            mouth, and advanced to the second part of duodenum.                            The upper GI endoscopy was accomplished without                            difficulty. The patient tolerated the procedure                            well. Scope In: 12:55:07 PM Scope Out: 1:02:13 PM Total Procedure Duration: 0 hours 7 minutes 6 seconds  Findings:      The examined esophagus was normal.      The Z-line was irregular and was found 37 cm from the incisors. Biopsies       were taken with a cold forceps for histology.      The entire examined stomach was normal.      The duodenal bulb and second portion of the duodenum were normal. Impression:               - Normal esophagus.                           - Z-line irregular, 37 cm from the incisors.                            Biopsied.                           - Normal stomach.                           - Normal duodenal bulb and  second portion of the                            duodenum. Moderate Sedation:      Moderate (conscious) sedation was administered by the endoscopy nurse       and supervised by the endoscopist. The following parameters were       monitored: oxygen saturation, heart rate, blood pressure, CO2       capnography and response to care. Total physician intraservice time was       15 minutes. Recommendation:           - Patient has a contact number available for                            emergencies. The signs and symptoms of potential                            delayed complications were discussed with the                            patient. Return to normal activities tomorrow.                            Written discharge instructions were provided to the                            patient.                           -  High fiber diet today.                           - Continue present medications.                           - Await pathology results.                           - See the other procedure note for documentation of                            additional recommendations. Procedure Code(s):        --- Professional ---                           [phone], Esophagogastroduodenoscopy, flexible,                            transoral; with biopsy, single or multiple                           99152, Moderate sedation services provided by the                            same physician or other qualified health care  professional performing the diagnostic or                            therapeutic service that the sedation supports,                            requiring the presence of an independent trained                            observer to assist in the monitoring of the                            patient's level of consciousness and physiological                            status; initial 15 minutes of intraservice time,                            patient age 42 years or older Diagnosis Code(s):        --- Professional ---                           K22.8, Other specified diseases of esophagus                           K21.0, Gastro-esophageal reflux disease with                            esophagitis CPT copyright 2016 American [employer] Association. All rights reserved. The codes documented in this report are preliminary and upon coder review may  be revised to meet current compliance requirements. Hildred Laser, MD Hildred Laser, MD 02/01/2016 1:38:54 PM This report has been signed electronically. Number of Addenda: 0

## 2016-02-01 NOTE — Discharge Instructions (Signed)
Resume usual medications and high fiber diet. Metamucil half to 1 packet mouth every night or one heaping tablespoon full daily. Anusol HCsuppository 1 per rectum daily at bedtime for 2 weeks No driving for 24 hours. Physician will call with biopsy results.     Colonoscopy, Care After These instructions give you information on caring for yourself after your procedure. Your doctor may also give you more specific instructions. Call your doctor if you have any problems or questions after your procedure. HOME CARE  Do not drive for 24 hours.  Do not sign important papers or use machinery for 24 hours.  You may shower.  You may go back to your usual activities, but go slower for the first 24 hours.  Take rest breaks often during the first 24 hours.  Walk around or use warm packs on your belly (abdomen) if you have belly cramping or gas.  Drink enough fluids to keep your pee (urine) clear or pale yellow.  Resume your normal diet. Avoid heavy or fried foods.  Avoid drinking alcohol for 24 hours or as told by your doctor.  Only take medicines as told by your doctor. If a tissue sample (biopsy) was taken during the procedure:   Do not take aspirin or blood thinners for 7 days, or as told by your doctor.  Do not drink alcohol for 7 days, or as told by your doctor.  Eat soft foods for the first 24 hours. GET HELP IF: You still have a small amount of blood in your poop (stool) 2-3 days after the procedure. GET HELP RIGHT AWAY IF:  You have more than a small amount of blood in your poop.  You see clumps of tissue (blood clots) in your poop.  Your belly is puffy (swollen).  You feel sick to your stomach (nauseous) or throw up (vomit).  You have a fever.  You have belly pain that gets worse and medicine does not help. MAKE SURE YOU:  Understand these instructions.  Will watch your condition.  Will get help right away if you are not doing well or get worse.   This  information is not intended to replace advice given to you by your health care provider. Make sure you discuss any questions you have with your health care provider.   Document Released: 07/06/2010 Document Revised: 06/08/2013 Document Reviewed: 02/08/2013 Elsevier Interactive Patient Education 2016 [place].    Esophagogastroduodenoscopy, Care After Refer to this sheet in the next few weeks. These instructions provide you with information about caring for yourself after your procedure. Your health care provider may also give you more specific instructions. Your treatment has been planned according to current medical practices, but problems sometimes occur. Call your health care provider if you have any problems or questions after your procedure. WHAT TO EXPECT AFTER THE PROCEDURE After your procedure, it is typical to feel:  Soreness in your throat.  Pain with swallowing.  Sick to your stomach (nauseous).  Bloated.  Dizzy.  Fatigued. HOME CARE INSTRUCTIONS  Do not eat or drink anything until the numbing medicine (local anesthetic) has worn off and your gag reflex has returned. You will know that the local anesthetic has worn off when you can swallow comfortably.  Do not drive or operate machinery until directed by your health care provider.  Take medicines only as directed by your health care provider. SEEK MEDICAL CARE IF:   You cannot stop coughing.  You are not urinating at all or less than  usual. SEEK IMMEDIATE MEDICAL CARE IF:  You have difficulty swallowing.  You cannot eat or drink.  You have worsening throat or chest pain.  You have dizziness or lightheadedness or you faint.  You have nausea or vomiting.  You have chills.  You have a fever.  You have severe abdominal pain.  You have black, tarry, or bloody stools.   This information is not intended to replace advice given to you by your health care provider. Make sure you discuss any questions you  have with your health care provider.   Document Released: 05/20/2012 Document Revised: 06/24/2014 Document Reviewed: 05/20/2012 Elsevier Interactive Patient Education 2016 [employer].    Diverticulosis Diverticulosis is the condition that develops when small pouches (diverticula) form in the wall of your colon. Your colon, or large intestine, is where water is absorbed and stool is formed. The pouches form when the inside layer of your colon pushes through weak spots in the outer layers of your colon. CAUSES  No one knows exactly what causes diverticulosis. RISK FACTORS  Being older than 32. Your risk for this condition increases with age. Diverticulosis is rare in people younger than 40 years. By age 53, almost everyone has it.  Eating a low-fiber diet.  Being frequently constipated.  Being overweight.  Not getting enough exercise.  Smoking.  Taking over-the-counter pain medicines, like aspirin and ibuprofen. SYMPTOMS  Most people with diverticulosis do not have symptoms. DIAGNOSIS  Because diverticulosis often has no symptoms, health care providers often discover the condition during an exam for other colon problems. In many cases, a health care provider will diagnose diverticulosis while using a flexible scope to examine the colon (colonoscopy). TREATMENT  If you have never developed an infection related to diverticulosis, you may not need treatment. If you have had an infection before, treatment may include:  Eating more fruits, vegetables, and grains.  Taking a fiber supplement.  Taking a live bacteria supplement (probiotic).  Taking medicine to relax your colon. HOME CARE INSTRUCTIONS   Drink at least 6-8 glasses of water each day to prevent constipation.  Try not to strain when you have a bowel movement.  Keep all follow-up appointments. If you have had an infection before:  Increase the fiber in your diet as directed by your health care provider or  dietitian.  Take a dietary fiber supplement if your health care provider approves.  Only take medicines as directed by your health care provider. SEEK MEDICAL CARE IF:   You have abdominal pain.  You have bloating.  You have cramps.  You have not gone to the bathroom in 3 days. SEEK IMMEDIATE MEDICAL CARE IF:   Your pain gets worse.  Yourbloating becomes very bad.  You have a fever or chills, and your symptoms suddenly get worse.  You begin vomiting.  You have bowel movements that are bloody or black. MAKE SURE YOU:  Understand these instructions.  Will watch your condition.  Will get help right away if you are not doing well or get worse.   This information is not intended to replace advice given to you by your health care provider. Make sure you discuss any questions you have with your health care provider.   Document Released: 02/29/2004 Document Revised: 06/08/2013 Document Reviewed: 04/28/2013 Elsevier Interactive Patient Education 2016 [employer] Inc[employer]   High-Fiber Diet Fiber, also called dietary fiber, is a type of carbohydrate found in fruits, vegetables, whole grains, and beans. A high-fiber diet can have many health benefits.  Your health care provider may recommend a high-fiber diet to help:  Prevent constipation. Fiber can make your bowel movements more regular.  Lower your cholesterol.  Relieve hemorrhoids, uncomplicated diverticulosis, or irritable bowel syndrome.  Prevent overeating as part of a weight-loss plan.  Prevent heart disease, type 2 diabetes, and certain cancers. WHAT IS MY PLAN? The recommended daily intake of fiber includes:  38 grams for men under age 85.  19 grams for men over age 41.  57 grams for women under age 58.  15 grams for women over age 71. You can get the recommended daily intake of dietary fiber by eating a variety of fruits, vegetables, grains, and beans. Your health care provider may also recommend a fiber  supplement if it is not possible to get enough fiber through your diet. WHAT DO I NEED TO KNOW ABOUT A HIGH-FIBER DIET?  Fiber supplements have not been widely studied for their effectiveness, so it is better to get fiber through food sources.  Always check the fiber content on thenutrition facts label of any prepackaged food. Look for foods that contain at least 5 grams of fiber per serving.  Ask your dietitian if you have questions about specific foods that are related to your condition, especially if those foods are not listed in the following section.  Increase your daily fiber consumption gradually. Increasing your intake of dietary fiber too quickly may cause bloating, cramping, or gas.  Drink plenty of water. Water helps you to digest fiber. WHAT FOODS CAN I EAT? Grains Whole-grain breads. Multigrain cereal. Oats and oatmeal. Brown rice. Barley. Bulgur wheat. [place]. Bran muffins. Popcorn. Rye wafer crackers. Vegetables Sweet potatoes. Spinach. Kale. Artichokes. Cabbage. Broccoli. Green peas. Carrots. Squash. Fruits Berries. Pears. Apples. Oranges. Avocados. Prunes and raisins. Dried figs. Meats and Other Protein Sources [place], kidney, pinto, and soy beans. Split peas. Lentils. Nuts and seeds. Dairy Fiber-fortified yogurt. Beverages Fiber-fortified soy milk. Fiber-fortified orange juice. Other Fiber bars. The items listed above may not be a complete list of recommended foods or beverages. Contact your dietitian for more options. WHAT FOODS ARE NOT RECOMMENDED? Grains White bread. Pasta made with refined flour. White rice. Vegetables Fried potatoes. Canned vegetables. Well-cooked vegetables.  Fruits Fruit juice. Cooked, strained fruit. Meats and Other Protein Sources Fatty cuts of meat. Fried Sales executive or fried fish. Dairy Milk. Yogurt. Cream cheese. Sour cream. Beverages Soft drinks. Other Cakes and pastries. Butter and oils. The items listed above may not be a  complete list of foods and beverages to avoid. Contact your dietitian for more information. WHAT ARE SOME TIPS FOR INCLUDING HIGH-FIBER FOODS IN MY DIET?  Eat a wide variety of high-fiber foods.  Make sure that half of all grains consumed each day are whole grains.  Replace breads and cereals made from refined flour or white flour with whole-grain breads and cereals.  Replace white rice with brown rice, bulgur wheat, or millet.  Start the day with a breakfast that is high in fiber, such as a cereal that contains at least 5 grams of fiber per serving.  Use beans in place of meat in soups, salads, or pasta.  Eat high-fiber snacks, such as berries, raw vegetables, nuts, or popcorn.   This information is not intended to replace advice given to you by your health care provider. Make sure you discuss any questions you have with your health care provider.   Document Released: 06/03/2005 Document Revised: 06/24/2014 Document Reviewed: 11/16/2013 Elsevier Interactive Patient Education [employer].  Hemorrhoids Hemorrhoids are swollen veins around the rectum or anus. There are two types of hemorrhoids:   Internal hemorrhoids. These occur in the veins just inside the rectum. They may poke through to the outside and become irritated and painful.  External hemorrhoids. These occur in the veins outside the anus and can be felt as a painful swelling or hard lump near the anus. CAUSES  Pregnancy.   Obesity.   Constipation or diarrhea.   Straining to have a bowel movement.   Sitting for long periods on the toilet.  Heavy lifting or other activity that caused you to strain.  Anal intercourse. SYMPTOMS   Pain.   Anal itching or irritation.   Rectal bleeding.   Fecal leakage.   Anal swelling.   One or more lumps around the anus.  DIAGNOSIS  Your caregiver may be able to diagnose hemorrhoids by visual examination. Other examinations or tests that may be  performed include:   Examination of the rectal area with a gloved hand (digital rectal exam).   Examination of anal canal using a small tube (scope).   A blood test if you have lost a significant amount of blood.  A test to look inside the colon (sigmoidoscopy or colonoscopy). TREATMENT Most hemorrhoids can be treated at home. However, if symptoms do not seem to be getting better or if you have a lot of rectal bleeding, your caregiver may perform a procedure to help make the hemorrhoids get smaller or remove them completely. Possible treatments include:   Placing a rubber band at the base of the hemorrhoid to cut off the circulation (rubber band ligation).   Injecting a chemical to shrink the hemorrhoid (sclerotherapy).   Using a tool to burn the hemorrhoid (infrared light therapy).   Surgically removing the hemorrhoid (hemorrhoidectomy).   Stapling the hemorrhoid to block blood flow to the tissue (hemorrhoid stapling).  HOME CARE INSTRUCTIONS   Eat foods with fiber, such as whole grains, beans, nuts, fruits, and vegetables. Ask your doctor about taking products with added fiber in them (fibersupplements).  Increase fluid intake. Drink enough water and fluids to keep your urine clear or pale yellow.   Exercise regularly.   Go to the bathroom when you have the urge to have a bowel movement. Do not wait.   Avoid straining to have bowel movements.   Keep the anal area dry and clean. Use wet toilet paper or moist towelettes after a bowel movement.   Medicated creams and suppositories may be used or applied as directed.   Only take over-the-counter or prescription medicines as directed by your caregiver.   Take warm sitz baths for 15-20 minutes, 3-4 times a day to ease pain and discomfort.   Place ice packs on the hemorrhoids if they are tender and swollen. Using ice packs between sitz baths may be helpful.   Put ice in a plastic bag.   Place a towel between  your skin and the bag.   Leave the ice on for 15-20 minutes, 3-4 times a day.   Do not use a donut-shaped pillow or sit on the toilet for long periods. This increases blood pooling and pain.  SEEK MEDICAL CARE IF:  You have increasing pain and swelling that is not controlled by treatment or medicine.  You have uncontrolled bleeding.  You have difficulty or you are unable to have a bowel movement.  You have pain or inflammation outside the area of the hemorrhoids. MAKE SURE YOU:  Understand these  instructions.  Will watch your condition.  Will get help right away if you are not doing well or get worse.   This information is not intended to replace advice given to you by your health care provider. Make sure you discuss any questions you have with your health care provider.   Document Released: 05/31/2000 Document Revised: 05/20/2012 Document Reviewed: 04/07/2012 Elsevier Interactive Patient Education [employer].

## 2016-02-02 DIAGNOSIS — K210 Gastro-esophageal reflux disease with esophagitis: Secondary | ICD-10-CM | POA: Diagnosis not present

## 2016-02-06 ENCOUNTER — Encounter (HOSPITAL_COMMUNITY): Payer: Self-pay | Admitting: Internal Medicine

## 2016-02-09 DIAGNOSIS — H9209 Otalgia, unspecified ear: Secondary | ICD-10-CM | POA: Diagnosis not present

## 2016-04-03 DIAGNOSIS — I10 Essential (primary) hypertension: Secondary | ICD-10-CM | POA: Diagnosis not present

## 2016-04-03 DIAGNOSIS — E119 Type 2 diabetes mellitus without complications: Secondary | ICD-10-CM | POA: Diagnosis not present

## 2016-04-05 DIAGNOSIS — G43019 Migraine without aura, intractable, without status migrainosus: Secondary | ICD-10-CM | POA: Diagnosis not present

## 2016-04-05 DIAGNOSIS — Z0000 Encounter for general adult medical examination without abnormal findings: Secondary | ICD-10-CM | POA: Diagnosis not present

## 2016-04-05 DIAGNOSIS — Z23 Encounter for immunization: Secondary | ICD-10-CM | POA: Diagnosis not present

## 2016-04-05 DIAGNOSIS — E119 Type 2 diabetes mellitus without complications: Secondary | ICD-10-CM | POA: Diagnosis not present

## 2016-04-05 DIAGNOSIS — E876 Hypokalemia: Secondary | ICD-10-CM | POA: Diagnosis not present

## 2016-04-05 DIAGNOSIS — E782 Mixed hyperlipidemia: Secondary | ICD-10-CM | POA: Diagnosis not present

## 2016-04-05 DIAGNOSIS — K219 Gastro-esophageal reflux disease without esophagitis: Secondary | ICD-10-CM | POA: Diagnosis not present

## 2016-04-05 DIAGNOSIS — J302 Other seasonal allergic rhinitis: Secondary | ICD-10-CM | POA: Diagnosis not present

## 2016-04-22 ENCOUNTER — Other Ambulatory Visit (HOSPITAL_COMMUNITY): Payer: Self-pay | Admitting: Internal Medicine

## 2016-04-22 DIAGNOSIS — Z1231 Encounter for screening mammogram for malignant neoplasm of breast: Secondary | ICD-10-CM

## 2016-04-22 DIAGNOSIS — Z780 Asymptomatic menopausal state: Secondary | ICD-10-CM

## 2016-05-02 ENCOUNTER — Ambulatory Visit (HOSPITAL_COMMUNITY)
Admission: RE | Admit: 2016-05-02 | Discharge: 2016-05-02 | Disposition: A | Discharge status: Home / Self Care | Payer: Commercial Managed Care - HMO | Source: Ambulatory Visit | Attending: Internal Medicine | Admitting: Internal Medicine

## 2016-05-02 ENCOUNTER — Ambulatory Visit (HOSPITAL_COMMUNITY): Payer: Commercial Managed Care - HMO

## 2016-05-02 DIAGNOSIS — M810 Age-related osteoporosis without current pathological fracture: Secondary | ICD-10-CM | POA: Insufficient documentation

## 2016-05-02 DIAGNOSIS — Z780 Asymptomatic menopausal state: Secondary | ICD-10-CM | POA: Diagnosis not present

## 2016-05-02 DIAGNOSIS — Z1231 Encounter for screening mammogram for malignant neoplasm of breast: Secondary | ICD-10-CM

## 2016-05-08 ENCOUNTER — Ambulatory Visit (HOSPITAL_COMMUNITY): Payer: Commercial Managed Care - HMO

## 2016-06-06 DIAGNOSIS — K219 Gastro-esophageal reflux disease without esophagitis: Secondary | ICD-10-CM | POA: Diagnosis not present

## 2016-06-06 DIAGNOSIS — K649 Unspecified hemorrhoids: Secondary | ICD-10-CM | POA: Diagnosis not present

## 2016-07-26 DIAGNOSIS — J060 Acute laryngopharyngitis: Secondary | ICD-10-CM | POA: Diagnosis not present

## 2016-07-26 DIAGNOSIS — R05 Cough: Secondary | ICD-10-CM | POA: Diagnosis not present

## 2016-08-13 DIAGNOSIS — Z6823 Body mass index (BMI) 23.0-23.9, adult: Secondary | ICD-10-CM | POA: Diagnosis not present

## 2016-08-13 DIAGNOSIS — R1084 Generalized abdominal pain: Secondary | ICD-10-CM | POA: Diagnosis not present

## 2016-08-27 ENCOUNTER — Other Ambulatory Visit (HOSPITAL_COMMUNITY): Payer: Self-pay | Admitting: Internal Medicine

## 2016-08-27 ENCOUNTER — Ambulatory Visit (HOSPITAL_COMMUNITY)
Admission: RE | Admit: 2016-08-27 | Discharge: 2016-08-27 | Disposition: A | Discharge status: Home / Self Care | Payer: Medicare HMO | Source: Ambulatory Visit | Attending: Internal Medicine | Admitting: Internal Medicine

## 2016-08-27 DIAGNOSIS — R197 Diarrhea, unspecified: Secondary | ICD-10-CM

## 2016-08-27 DIAGNOSIS — R1032 Left lower quadrant pain: Secondary | ICD-10-CM | POA: Diagnosis not present

## 2016-08-27 DIAGNOSIS — K5730 Diverticulosis of large intestine without perforation or abscess without bleeding: Secondary | ICD-10-CM | POA: Insufficient documentation

## 2016-08-27 DIAGNOSIS — M438X4 Other specified deforming dorsopathies, thoracic region: Secondary | ICD-10-CM | POA: Insufficient documentation

## 2016-08-27 DIAGNOSIS — K5732 Diverticulitis of large intestine without perforation or abscess without bleeding: Secondary | ICD-10-CM | POA: Diagnosis not present

## 2016-08-27 DIAGNOSIS — Z6823 Body mass index (BMI) 23.0-23.9, adult: Secondary | ICD-10-CM | POA: Diagnosis not present

## 2016-08-27 LAB — POCT I-STAT CREATININE: CREATININE: 0.7 mg/dL (ref 0.44–1.00)

## 2016-08-27 MED ORDER — IOPAMIDOL (ISOVUE-300) INJECTION 61%
100.0000 mL | Freq: Once | INTRAVENOUS | Status: AC | PRN
  Administered 2016-08-27: 100 mL via INTRAVENOUS

## 2016-09-05 DIAGNOSIS — Z0100 Encounter for examination of eyes and vision without abnormal findings: Secondary | ICD-10-CM | POA: Diagnosis not present

## 2016-09-05 DIAGNOSIS — H2513 Age-related nuclear cataract, bilateral: Secondary | ICD-10-CM | POA: Diagnosis not present

## 2016-09-24 DIAGNOSIS — Z01419 Encounter for gynecological examination (general) (routine) without abnormal findings: Secondary | ICD-10-CM | POA: Diagnosis not present

## 2016-09-24 DIAGNOSIS — Z6822 Body mass index (BMI) 22.0-22.9, adult: Secondary | ICD-10-CM | POA: Diagnosis not present

## 2016-09-24 DIAGNOSIS — Z124 Encounter for screening for malignant neoplasm of cervix: Secondary | ICD-10-CM | POA: Diagnosis not present

## 2016-09-24 DIAGNOSIS — K648 Other hemorrhoids: Secondary | ICD-10-CM | POA: Diagnosis not present

## 2016-09-25 ENCOUNTER — Encounter (HOSPITAL_COMMUNITY)
Admission: RE | Admit: 2016-09-25 | Discharge: 2016-09-25 | Disposition: A | Discharge status: Home / Self Care | Payer: Medicare HMO | Source: Ambulatory Visit | Attending: Internal Medicine | Admitting: Internal Medicine

## 2016-09-25 DIAGNOSIS — M810 Age-related osteoporosis without current pathological fracture: Secondary | ICD-10-CM | POA: Insufficient documentation

## 2016-09-25 MED ORDER — DENOSUMAB 60 MG/ML ~~LOC~~ SOLN
60.0000 mg | Freq: Once | SUBCUTANEOUS | Status: AC
  Administered 2016-09-25: 60 mg via SUBCUTANEOUS
  Filled 2016-09-25: qty 1

## 2016-10-03 DIAGNOSIS — E782 Mixed hyperlipidemia: Secondary | ICD-10-CM | POA: Diagnosis not present

## 2016-10-03 DIAGNOSIS — E119 Type 2 diabetes mellitus without complications: Secondary | ICD-10-CM | POA: Diagnosis not present

## 2016-10-03 DIAGNOSIS — I10 Essential (primary) hypertension: Secondary | ICD-10-CM | POA: Diagnosis not present

## 2016-10-11 DIAGNOSIS — K219 Gastro-esophageal reflux disease without esophagitis: Secondary | ICD-10-CM | POA: Diagnosis not present

## 2016-10-11 DIAGNOSIS — Z23 Encounter for immunization: Secondary | ICD-10-CM | POA: Diagnosis not present

## 2016-10-11 DIAGNOSIS — G43019 Migraine without aura, intractable, without status migrainosus: Secondary | ICD-10-CM | POA: Diagnosis not present

## 2016-10-11 DIAGNOSIS — E782 Mixed hyperlipidemia: Secondary | ICD-10-CM | POA: Diagnosis not present

## 2016-10-11 DIAGNOSIS — Z6823 Body mass index (BMI) 23.0-23.9, adult: Secondary | ICD-10-CM | POA: Diagnosis not present

## 2016-10-11 DIAGNOSIS — J302 Other seasonal allergic rhinitis: Secondary | ICD-10-CM | POA: Diagnosis not present

## 2016-10-11 DIAGNOSIS — R1032 Left lower quadrant pain: Secondary | ICD-10-CM | POA: Diagnosis not present

## 2016-10-11 DIAGNOSIS — E876 Hypokalemia: Secondary | ICD-10-CM | POA: Diagnosis not present

## 2016-10-11 DIAGNOSIS — Z0000 Encounter for general adult medical examination without abnormal findings: Secondary | ICD-10-CM | POA: Diagnosis not present

## 2017-01-27 DIAGNOSIS — Z6823 Body mass index (BMI) 23.0-23.9, adult: Secondary | ICD-10-CM | POA: Diagnosis not present

## 2017-01-27 DIAGNOSIS — M5430 Sciatica, unspecified side: Secondary | ICD-10-CM | POA: Diagnosis not present

## 2017-01-27 DIAGNOSIS — G2581 Restless legs syndrome: Secondary | ICD-10-CM | POA: Diagnosis not present

## 2017-01-27 DIAGNOSIS — M79605 Pain in left leg: Secondary | ICD-10-CM | POA: Diagnosis not present

## 2017-03-14 DIAGNOSIS — H6500 Acute serous otitis media, unspecified ear: Secondary | ICD-10-CM | POA: Diagnosis not present

## 2017-03-14 DIAGNOSIS — Z23 Encounter for immunization: Secondary | ICD-10-CM | POA: Diagnosis not present

## 2017-03-14 DIAGNOSIS — M792 Neuralgia and neuritis, unspecified: Secondary | ICD-10-CM | POA: Diagnosis not present

## 2017-03-14 DIAGNOSIS — Z6823 Body mass index (BMI) 23.0-23.9, adult: Secondary | ICD-10-CM | POA: Diagnosis not present

## 2017-03-21 ENCOUNTER — Other Ambulatory Visit (HOSPITAL_COMMUNITY): Payer: Self-pay | Admitting: Internal Medicine

## 2017-03-21 DIAGNOSIS — Z1231 Encounter for screening mammogram for malignant neoplasm of breast: Secondary | ICD-10-CM

## 2017-03-25 DIAGNOSIS — E782 Mixed hyperlipidemia: Secondary | ICD-10-CM | POA: Diagnosis not present

## 2017-03-25 DIAGNOSIS — I10 Essential (primary) hypertension: Secondary | ICD-10-CM | POA: Diagnosis not present

## 2017-03-25 DIAGNOSIS — E119 Type 2 diabetes mellitus without complications: Secondary | ICD-10-CM | POA: Diagnosis not present

## 2017-03-27 ENCOUNTER — Encounter (HOSPITAL_COMMUNITY): Payer: Medicare HMO

## 2017-03-27 ENCOUNTER — Encounter (HOSPITAL_COMMUNITY): Admission: RE | Admit: 2017-03-27 | Payer: Medicare HMO | Source: Ambulatory Visit

## 2017-03-28 ENCOUNTER — Encounter (HOSPITAL_COMMUNITY): Payer: Self-pay

## 2017-03-28 ENCOUNTER — Encounter (HOSPITAL_COMMUNITY)
Admission: RE | Admit: 2017-03-28 | Discharge: 2017-03-28 | Disposition: A | Discharge status: Home / Self Care | Payer: Medicare HMO | Source: Ambulatory Visit | Attending: Internal Medicine | Admitting: Internal Medicine

## 2017-03-28 DIAGNOSIS — J302 Other seasonal allergic rhinitis: Secondary | ICD-10-CM | POA: Diagnosis not present

## 2017-03-28 DIAGNOSIS — G9009 Other idiopathic peripheral autonomic neuropathy: Secondary | ICD-10-CM | POA: Diagnosis not present

## 2017-03-28 DIAGNOSIS — Z6823 Body mass index (BMI) 23.0-23.9, adult: Secondary | ICD-10-CM | POA: Diagnosis not present

## 2017-03-28 DIAGNOSIS — G43019 Migraine without aura, intractable, without status migrainosus: Secondary | ICD-10-CM | POA: Diagnosis not present

## 2017-03-28 DIAGNOSIS — M810 Age-related osteoporosis without current pathological fracture: Secondary | ICD-10-CM | POA: Insufficient documentation

## 2017-03-28 DIAGNOSIS — E876 Hypokalemia: Secondary | ICD-10-CM | POA: Diagnosis not present

## 2017-03-28 DIAGNOSIS — K219 Gastro-esophageal reflux disease without esophagitis: Secondary | ICD-10-CM | POA: Diagnosis not present

## 2017-03-28 DIAGNOSIS — E782 Mixed hyperlipidemia: Secondary | ICD-10-CM | POA: Diagnosis not present

## 2017-03-28 MED ORDER — DENOSUMAB 60 MG/ML ~~LOC~~ SOLN
60.0000 mg | Freq: Once | SUBCUTANEOUS | Status: AC
  Administered 2017-03-28: 60 mg via SUBCUTANEOUS
  Filled 2017-03-28: qty 1

## 2017-05-05 ENCOUNTER — Ambulatory Visit (HOSPITAL_COMMUNITY)
Admission: RE | Admit: 2017-05-05 | Discharge: 2017-05-05 | Disposition: A | Discharge status: Home / Self Care | Payer: Medicare HMO | Source: Ambulatory Visit | Attending: Internal Medicine | Admitting: Internal Medicine

## 2017-05-05 ENCOUNTER — Encounter (HOSPITAL_COMMUNITY): Payer: Self-pay

## 2017-05-05 DIAGNOSIS — Z1231 Encounter for screening mammogram for malignant neoplasm of breast: Secondary | ICD-10-CM | POA: Diagnosis not present

## 2017-07-09 ENCOUNTER — Encounter: Payer: Self-pay | Admitting: Orthopedic Surgery

## 2017-07-09 ENCOUNTER — Ambulatory Visit (INDEPENDENT_AMBULATORY_CARE_PROVIDER_SITE_OTHER): Payer: Medicare HMO

## 2017-07-09 ENCOUNTER — Ambulatory Visit: Payer: Medicare HMO | Admitting: Orthopedic Surgery

## 2017-07-09 VITALS — BP 127/66 | HR 73 | Ht 66.0 in | Wt 133.0 lb

## 2017-07-09 DIAGNOSIS — M25512 Pain in left shoulder: Secondary | ICD-10-CM

## 2017-07-09 NOTE — Progress Notes (Signed)
Patient ID: Judy Watts, female   DOB: [birth year], 72 y.o.   MRN: [id number]  Chief Complaint  Patient presents with  . Shoulder Pain    left    HPI Judy Watts is a 72 y.o. female.  Presents for evaluation of left shoulder pain.  She has 39-month history of left shoulder pain primarily which reaching behind her no pain with forward elevation no loss of motion pain is positional related and described as dull ache no history of trauma no loss of motion  Review of Systems Review of Systems  Constitutional: Negative.   Respiratory: Negative for shortness of breath.   Cardiovascular: Negative for chest pain.  Skin: Negative.   All other systems reviewed and are negative.   Past Medical History:  Diagnosis Date  . Chronic sinusitis   . GERD (gastroesophageal reflux disease)   . H/O seasonal allergies   . Hypertension   . Migraine     Past Surgical History:  Procedure Laterality Date  . CERVIX SURGERY    . COLONOSCOPY    . COLONOSCOPY N/A 02/01/2016   Procedure: COLONOSCOPY;  Surgeon: Rogene Houston, MD;  Location: [facility];  Service: Endoscopy;  Laterality: N/A;  . ESOPHAGOGASTRODUODENOSCOPY N/A 02/01/2016   Procedure: ESOPHAGOGASTRODUODENOSCOPY (EGD);  Surgeon: Rogene Houston, MD;  Location: [facility];  Service: Endoscopy;  Laterality: N/A;  1:25  . TUBAL LIGATION      History reviewed. No pertinent family history.   Social History   Tobacco Use  . Smoking status: Never Smoker  . Smokeless tobacco: Never Used  Substance Use Topics  . Alcohol use: No  . Drug use: No    No Known Allergies   Current Meds  Medication Sig  . albuterol (PROVENTIL HFA;VENTOLIN HFA) 108 (90 BASE) MCG/ACT inhaler Inhale 2 puffs into the lungs every 4 (four) hours as needed for wheezing or shortness of breath.  Marland Kitchen aspirin 81 MG tablet Take 81 mg by mouth daily.  . Biotin 5000 MCG TABS Take 1 tablet by mouth daily. Reported on 11/30/2015  . diclofenac (VOLTAREN) 75 MG EC  tablet Take 75 mg by mouth 2 (two) times daily.  Marland Kitchen esomeprazole (NEXIUM) 40 MG capsule Take 40 mg by mouth daily before breakfast. Reported on 11/30/2015  . fish oil-omega-3 fatty acids 1000 MG capsule Take 1 g by mouth daily. Reported on 11/30/2015  . fluticasone (FLONASE) 50 MCG/ACT nasal spray Place 2 sprays into the nose daily.  Marland Kitchen gabapentin (NEURONTIN) 300 MG capsule Take 1 capsule by mouth 3 (three) times daily.  . hydrochlorothiazide (HYDRODIURIL) 25 MG tablet Take 1 tablet by mouth daily.  Marland Kitchen HYDROcodone-acetaminophen (NORCO/VICODIN) 5-325 MG tablet Take 1 tablet by mouth every 6 (six) hours as needed for moderate pain.  . hydrocortisone (ANUSOL-HC) 25 MG suppository Place 1 suppository (25 mg total) rectally at bedtime.  . montelukast (SINGULAIR) 10 MG tablet Take 10 mg by mouth at bedtime.  . Multiple Vitamin (MULTIVITAMIN) tablet Take 1 tablet by mouth daily.  . potassium chloride (K-DUR) 10 MEQ tablet Take 10 mEq by mouth daily.  . propranolol (INDERAL) 80 MG tablet Take 0.5 tablets (40 mg total) by mouth 2 (two) times daily.  . psyllium (METAMUCIL SMOOTH TEXTURE) 28 % packet Take 1 packet by mouth at bedtime.  . topiramate (TOPAMAX) 25 MG tablet Take 1 tablet by mouth 2 (two) times daily.       Physical Exam BP 127/66   Pulse 73   Ht  5\' 6"  (1.676 m)   Wt 133 lb (60.3 kg)   BMI 21.47 kg/m  Physical Exam  Constitutional: She is oriented to person, place, and time. She appears well-developed and well-nourished.  Neurological: She is alert and oriented to person, place, and time.  Psychiatric: She has a normal mood and affect. Judgment normal.  Vitals reviewed.  Ambulatory status normal with no assistive devices Right Shoulder Exam  Right shoulder exam is normal.  Tenderness  The patient is experiencing no tenderness.  Range of Motion  The patient has normal right shoulder ROM.  Muscle Strength  The patient has normal right shoulder strength.  Tests  Apprehension:  negative  Other  Erythema: absent Sensation: normal Pulse: present   Left Shoulder Exam  Left shoulder exam is normal.  Tenderness  The patient is experiencing no tenderness.   Range of Motion  Left shoulder internal rotation 0 degrees: She has approximately 5-6 level difference on internal rotation regarding the thoracic spine.   Muscle Strength  The patient has normal left shoulder strength.  Tests  Apprehension: negative Cross arm: negative Impingement: positive Drop arm: negative  Other  Erythema: absent Sensation: normal Pulse: present       Provocative test  drop arm test normal  painful arc only with internal rotation of the left arm  empty can 5 out of 5 strength Jobst test negative Infraspinatus muscle strength test normal External rotation lag sign normal Liftoff normal Belly press test normal Supraspinatus motor exam normal   Data Reviewed Imaging of the left shoulder are independently reviewed and I interpreted these as normal left shoulder with greater tuberosity sclerosis  Assessment  Encounter Diagnosis  Name Primary?  . Pain in joint of left shoulder Yes     Plan  Mild impingement  Subacromial injection was given.  Follow-up as needed  Procedure note the subacromial injection shoulder left   Verbal consent was obtained to inject the  Left   Shoulder  Timeout was completed to confirm the injection site is a subacromial space of the  left  shoulder  Medication used Depo-Medrol 40 mg and lidocaine 1% 3 cc  Anesthesia was provided by ethyl chloride  The injection was performed in the left  posterior subacromial space. After pinning the skin with alcohol and anesthetized the skin with ethyl chloride the subacromial space was injected using a 20-gauge needle. There were no complications  Sterile dressing was applied.

## 2017-08-07 DIAGNOSIS — J302 Other seasonal allergic rhinitis: Secondary | ICD-10-CM | POA: Diagnosis not present

## 2017-08-07 DIAGNOSIS — H9202 Otalgia, left ear: Secondary | ICD-10-CM | POA: Diagnosis not present

## 2017-09-01 DIAGNOSIS — M25512 Pain in left shoulder: Secondary | ICD-10-CM | POA: Diagnosis not present

## 2017-09-01 DIAGNOSIS — Z6822 Body mass index (BMI) 22.0-22.9, adult: Secondary | ICD-10-CM | POA: Diagnosis not present

## 2017-09-01 DIAGNOSIS — R109 Unspecified abdominal pain: Secondary | ICD-10-CM | POA: Diagnosis not present

## 2017-09-01 DIAGNOSIS — R110 Nausea: Secondary | ICD-10-CM | POA: Diagnosis not present

## 2017-09-26 ENCOUNTER — Ambulatory Visit (HOSPITAL_COMMUNITY): Payer: Medicare HMO

## 2017-09-29 DIAGNOSIS — Z6822 Body mass index (BMI) 22.0-22.9, adult: Secondary | ICD-10-CM | POA: Diagnosis not present

## 2017-09-29 DIAGNOSIS — R109 Unspecified abdominal pain: Secondary | ICD-10-CM | POA: Diagnosis not present

## 2017-09-29 DIAGNOSIS — H9202 Otalgia, left ear: Secondary | ICD-10-CM | POA: Diagnosis not present

## 2017-09-29 DIAGNOSIS — J302 Other seasonal allergic rhinitis: Secondary | ICD-10-CM | POA: Diagnosis not present

## 2017-09-29 DIAGNOSIS — M25512 Pain in left shoulder: Secondary | ICD-10-CM | POA: Diagnosis not present

## 2017-09-29 DIAGNOSIS — R110 Nausea: Secondary | ICD-10-CM | POA: Diagnosis not present

## 2017-09-29 DIAGNOSIS — E782 Mixed hyperlipidemia: Secondary | ICD-10-CM | POA: Diagnosis not present

## 2017-09-29 DIAGNOSIS — E119 Type 2 diabetes mellitus without complications: Secondary | ICD-10-CM | POA: Diagnosis not present

## 2017-09-29 DIAGNOSIS — E876 Hypokalemia: Secondary | ICD-10-CM | POA: Diagnosis not present

## 2017-09-30 DIAGNOSIS — K219 Gastro-esophageal reflux disease without esophagitis: Secondary | ICD-10-CM | POA: Diagnosis not present

## 2017-09-30 DIAGNOSIS — R109 Unspecified abdominal pain: Secondary | ICD-10-CM | POA: Diagnosis not present

## 2017-09-30 DIAGNOSIS — R1013 Epigastric pain: Secondary | ICD-10-CM | POA: Diagnosis not present

## 2017-09-30 DIAGNOSIS — Z8719 Personal history of other diseases of the digestive system: Secondary | ICD-10-CM | POA: Diagnosis not present

## 2017-10-01 ENCOUNTER — Encounter (HOSPITAL_COMMUNITY)
Admission: RE | Admit: 2017-10-01 | Discharge: 2017-10-01 | Disposition: A | Discharge status: Home / Self Care | Payer: Medicare HMO | Source: Ambulatory Visit | Attending: Internal Medicine | Admitting: Internal Medicine

## 2017-10-01 ENCOUNTER — Encounter (HOSPITAL_COMMUNITY): Payer: Self-pay

## 2017-10-01 DIAGNOSIS — K219 Gastro-esophageal reflux disease without esophagitis: Secondary | ICD-10-CM | POA: Diagnosis not present

## 2017-10-01 DIAGNOSIS — E876 Hypokalemia: Secondary | ICD-10-CM | POA: Diagnosis not present

## 2017-10-01 DIAGNOSIS — G8929 Other chronic pain: Secondary | ICD-10-CM | POA: Diagnosis not present

## 2017-10-01 DIAGNOSIS — M810 Age-related osteoporosis without current pathological fracture: Secondary | ICD-10-CM | POA: Insufficient documentation

## 2017-10-01 DIAGNOSIS — M25512 Pain in left shoulder: Secondary | ICD-10-CM | POA: Diagnosis not present

## 2017-10-01 DIAGNOSIS — E782 Mixed hyperlipidemia: Secondary | ICD-10-CM | POA: Diagnosis not present

## 2017-10-01 DIAGNOSIS — G43009 Migraine without aura, not intractable, without status migrainosus: Secondary | ICD-10-CM | POA: Diagnosis not present

## 2017-10-01 DIAGNOSIS — G9009 Other idiopathic peripheral autonomic neuropathy: Secondary | ICD-10-CM | POA: Diagnosis not present

## 2017-10-01 DIAGNOSIS — Z6822 Body mass index (BMI) 22.0-22.9, adult: Secondary | ICD-10-CM | POA: Diagnosis not present

## 2017-10-01 DIAGNOSIS — J302 Other seasonal allergic rhinitis: Secondary | ICD-10-CM | POA: Diagnosis not present

## 2017-10-01 DIAGNOSIS — R110 Nausea: Secondary | ICD-10-CM | POA: Diagnosis not present

## 2017-10-01 DIAGNOSIS — Z0000 Encounter for general adult medical examination without abnormal findings: Secondary | ICD-10-CM | POA: Diagnosis not present

## 2017-10-01 DIAGNOSIS — R109 Unspecified abdominal pain: Secondary | ICD-10-CM | POA: Diagnosis not present

## 2017-10-01 DIAGNOSIS — H9202 Otalgia, left ear: Secondary | ICD-10-CM | POA: Diagnosis not present

## 2017-10-01 MED ORDER — DENOSUMAB 60 MG/ML ~~LOC~~ SOSY
60.0000 mg | PREFILLED_SYRINGE | Freq: Once | SUBCUTANEOUS | Status: AC
Start: 2017-10-01 — End: 2017-10-01
  Administered 2017-10-01: 60 mg via SUBCUTANEOUS
  Filled 2017-10-01: qty 1

## 2017-10-01 MED ORDER — DENOSUMAB 60 MG/ML ~~LOC~~ SOLN
60.0000 mg | Freq: Once | SUBCUTANEOUS | Status: DC
  Filled 2017-10-01: qty 1

## 2017-10-29 ENCOUNTER — Ambulatory Visit: Payer: Medicare HMO | Admitting: Orthopedic Surgery

## 2017-10-29 ENCOUNTER — Telehealth: Payer: Self-pay | Admitting: Radiology

## 2017-10-29 VITALS — BP 116/76 | HR 53 | Ht 66.0 in | Wt 133.0 lb

## 2017-10-29 DIAGNOSIS — M75122 Complete rotator cuff tear or rupture of left shoulder, not specified as traumatic: Secondary | ICD-10-CM | POA: Diagnosis not present

## 2017-10-29 DIAGNOSIS — M25512 Pain in left shoulder: Secondary | ICD-10-CM

## 2017-10-29 NOTE — Progress Notes (Signed)
Progress Note   Patient ID: Judy Watts, female   DOB: Jan 23, 1946, 72 y.o.   MRN: [id number] Chief Complaint  Patient presents with  . Follow-up    Recheck on left shoulder.     Medical decision-making  Imaging:  Xrays ordered: y/n ? no  Encounter Diagnoses  Name Primary?  . Pain in joint of left shoulder Yes  . Nontraumatic complete tear of left rotator cuff       No orders of the defined types were placed in this encounter.   PLAN:  MRI left shoulder for rotator cuff tear        Chief Complaint  Patient presents with  . Follow-up    Recheck on left shoulder.    72 year old female previously treated for bursitis of the left shoulder with subacromial injection.  She also took Voltaren and hydrocodone did not improve.  She did home exercise program with no relief presents back still complaining of left shoulder pain loss of motion and weakness especially when objects are placed in her hand and she abducts or elevates her left arm    Review of Systems  Musculoskeletal: Negative for neck pain.  Neurological: Negative for tingling.   No outpatient medications have been marked as taking for the 10/29/17 encounter (Office Visit) with Carole Civil, MD.    Past Medical History:  Diagnosis Date  . Chronic sinusitis   . GERD (gastroesophageal reflux disease)   . H/O seasonal allergies   . Hypertension   . Migraine      No Known Allergies  BP 116/76   Pulse (!) 53   Ht 5\' 6"  (1.676 m)   Wt 133 lb (60.3 kg)   BMI 21.47 kg/m    Physical Exam  Constitutional: She is oriented to person, place, and time. She appears well-developed and well-nourished.  Neurological: She is alert and oriented to person, place, and time.  Psychiatric: She has a normal mood and affect. Judgment normal.  Vitals reviewed.   Left Shoulder Exam   Tenderness  The patient is experiencing tenderness in the acromion.  Range of Motion  Active abduction: abnormal  Passive  abduction: abnormal  External rotation: normal  Forward flexion: abnormal  Internal rotation 0 degrees: abnormal   Muscle Strength  Abduction: 4/5  Internal rotation: 5/5  External rotation: 5/5  Supraspinatus: 4/5  Subscapularis: 5/5  Biceps: 5/5   Tests  Apprehension: negative Impingement: positive Drop arm: positive Sulcus: absent      Arther Abbott, MD  11:57 AM 10/29/2017

## 2017-10-29 NOTE — Telephone Encounter (Signed)
Called her to advise of the MRI appointment

## 2017-11-03 ENCOUNTER — Ambulatory Visit: Payer: Self-pay | Admitting: Surgery

## 2017-11-03 DIAGNOSIS — K643 Fourth degree hemorrhoids: Secondary | ICD-10-CM | POA: Diagnosis not present

## 2017-11-03 DIAGNOSIS — K642 Third degree hemorrhoids: Secondary | ICD-10-CM | POA: Diagnosis not present

## 2017-11-03 DIAGNOSIS — K644 Residual hemorrhoidal skin tags: Secondary | ICD-10-CM | POA: Diagnosis not present

## 2017-11-03 DIAGNOSIS — K5732 Diverticulitis of large intestine without perforation or abscess without bleeding: Secondary | ICD-10-CM | POA: Diagnosis not present

## 2017-11-03 DIAGNOSIS — Z01818 Encounter for other preprocedural examination: Secondary | ICD-10-CM | POA: Diagnosis not present

## 2017-11-03 NOTE — H&P (Signed)
Judy Watts Documented: 11/03/2017 10:16 AM Location: [facility] Patient #: [id number] DOB: 1946/02/04 Married / Language: Judy Watts / Race: White Female  History of Present Illness Adin Hector MD; 11/03/2017 11:11 AM) The patient is a 72 year old female who presents with hemorrhoids. Note for "Hemorrhoids": ` ` ` Patient sent for surgical consultation at the request of Dr. Nevada Crane  Chief Complaint: Worsening prolapsed hemorrhoids  The patient is a pleasant active woman that is struggle with hemorrhoids since she was a teenager. She noted she did a lot of heavy lifting [place]. She had 3 kids as well. Vaginal deliveries. He notes that she is struggle with some irregular bowels from time but moves her bowels usually a couple times a day now. More recently she's had some crampy abdominal pain suspicious for mild diverticulitis. She thinks she's had at least a few tacks 2017/2018. Not this year. She was managed by Dr. Laural Golden and had a colonoscopy in 2017 that showed some sigmoid diverticulosis as well as some internal/external hemorrhoids. Recently has been under the care of Dr. Alessandra Bevels with [facility] gastroenterology. She notes that she feels hemorrhoids are out all the time now. Constantly having to wear a pad. Bleeding and irritating. Rather comfortable. For most of her life, she has been dissuaded from surgery. However, she had a friend who had hemorrhoid surgery and got through it fine and recommended the patient reconsider.  No personal nor family history of GI/colon cancer, inflammatory bowel disease, irritable bowel syndrome, allergy such as Celiac Sprue, dietary/dairy problems, colitis, ulcers nor gastritis. No recent sick contacts/gastroenteritis. No travel outside the country. No changes in diet. No dysphagia to solids or liquids. No significant heartburn or reflux. No hematochezia, hematemesis, coffee ground emesis. No evidence of prior  gastric/peptic ulceration. She walk a few miles without difficulty. No heart or lung issues. She does not smoke. No alcohol. She had a tubal ligation after her last pregnancy but no other abdominal surgeries.  (Review of systems as stated in this history (HPI) or in the review of systems. Otherwise all other 12 point ROS are negative) ` ` `   Diagnostic Studies History Illene Regulus, CMA; 11/03/2017 10:16 AM) Colonoscopy 1-5 years ago Mammogram within last year Pap Smear 1-5 years ago  Allergies Illene Regulus, CMA; 11/03/2017 10:17 AM) No Known Drug Allergies [11/03/2017]:  Medication History (Alisha Spillers, CMA; 11/03/2017 10:20 AM) Randel Books Aspirin (81MG  Tablet Chewable, Oral) Active. Azelastine HCl (0.1% Solution, Nasal) Active. Diclofenac Sodium (75MG  Tablet DR, Oral) Active. Gabapentin (300MG  Capsule, Oral) Active. HydroCHLOROthiazide (25MG  Tablet, Oral) Active. Hydrocodone-Acetaminophen (7.5-325MG  Tablet, Oral) Active. Montelukast Sodium (10MG  Tablet, Oral) Active. Pantoprazole Sodium (40MG  Tablet DR, Oral) Active. Propranolol HCl (80MG  Tablet, Oral) Active. Topiramate (25MG  Tablet, Oral) Active. Dicyclomine HCl (10MG  Capsule, Oral) Active. Loratadine (10MG  Tablet, Oral) Active. Centrum Silver (Oral) Active. Omega 3-6-9 (Oral) Active. Medications Reconciled  Social History Illene Regulus, CMA; 11/03/2017 10:16 AM) No alcohol use No caffeine use No drug use Tobacco use Never smoker.  Family History Illene Regulus, CMA; 11/03/2017 10:16 AM) Malignant Neoplasm Of Pancreas Family Members In General. Migraine Headache Daughter, Mother.  Pregnancy / Birth History Illene Regulus, CMA; 11/03/2017 10:16 AM) Age at menarche 20 years. Age of menopause 51-55 Contraceptive History Oral contraceptives. Gravida 7 Maternal age 48-20 Para 3  Other Problems Illene Regulus, CMA; 11/03/2017 10:16 AM) Back Pain Gastroesophageal Reflux  Disease Hemorrhoids High blood pressure Migraine Headache     Review of Systems Lars Mage  Spillers CMA; 11/03/2017 10:16 AM) General Not Present- Appetite Loss, Chills, Fatigue, Fever, Night Sweats, Weight Gain and Weight Loss. Skin Not Present- Change in Wart/Mole, Dryness, Hives, Jaundice, New Lesions, Non-Healing Wounds, Rash and Ulcer. HEENT Not Present- Earache, Hearing Loss, Hoarseness, Nose Bleed, Oral Ulcers, Ringing in the Ears, Seasonal Allergies, Sinus Pain, Sore Throat, Visual Disturbances, Wears glasses/contact lenses and Yellow Eyes. Respiratory Not Present- Bloody sputum, Chronic Cough, Difficulty Breathing, Snoring and Wheezing. Breast Not Present- Breast Mass, Breast Pain, Nipple Discharge and Skin Changes. Cardiovascular Not Present- Chest Pain, Difficulty Breathing Lying Down, Leg Cramps, Palpitations, Rapid Heart Rate, Shortness of Breath and Swelling of Extremities. Gastrointestinal Present- Hemorrhoids and Rectal Pain. Not Present- Abdominal Pain, Bloating, Bloody Stool, Change in Bowel Habits, Chronic diarrhea, Constipation, Difficulty Swallowing, Excessive gas, Gets full quickly at meals, Indigestion, Nausea and Vomiting. Female Genitourinary Not Present- Frequency, Nocturia, Painful Urination, Pelvic Pain and Urgency. Musculoskeletal Not Present- Back Pain, Joint Pain, Joint Stiffness, Muscle Pain, Muscle Weakness and Swelling of Extremities. Neurological Not Present- Decreased Memory, Fainting, Headaches, Numbness, Seizures, Tingling, Tremor, Trouble walking and Weakness. Psychiatric Not Present- Anxiety, Bipolar, Change in Sleep Pattern, Depression, Fearful and Frequent crying. Endocrine Not Present- Cold Intolerance, Excessive Hunger, Hair Changes, Heat Intolerance, Hot flashes and New Diabetes. Hematology Not Present- Blood Thinners, Easy Bruising, Excessive bleeding, Gland problems, HIV and Persistent Infections.  Vitals (Alisha Spillers CMA; 11/03/2017 10:17  AM) 11/03/2017 10:17 AM Weight: 133.8 lb Height: 66in Body Surface Area: 1.69 m Body Mass Index: 21.6 kg/m  Pulse: 60 (Regular)  BP: 118/74 (Sitting, Left Arm, Standard)      Physical Exam Adin Hector MD; 11/03/2017 10:36 AM)  General Mental Status-Alert. General Appearance-Not in acute distress, Not Sickly. Orientation-Oriented X3. Hydration-Well hydrated. Voice-Normal.  Integumentary Global Assessment Upon inspection and palpation of skin surfaces of the - Axillae: non-tender, no inflammation or ulceration, no drainage. and Distribution of scalp and body hair is normal. General Characteristics Temperature - normal warmth is noted.  Head and Neck Head-normocephalic, atraumatic with no lesions or palpable masses. Face Global Assessment - atraumatic, no absence of expression. Neck Global Assessment - no abnormal movements, no bruit auscultated on the right, no bruit auscultated on the left, no decreased range of motion, non-tender. Trachea-midline. Thyroid Gland Characteristics - non-tender.  Eye Eyeball - Left-Extraocular movements intact, No Nystagmus. Eyeball - Right-Extraocular movements intact, No Nystagmus. Cornea - Left-No Hazy. Cornea - Right-No Hazy. Sclera/Conjunctiva - Left-No scleral icterus, No Discharge. Sclera/Conjunctiva - Right-No scleral icterus, No Discharge. Pupil - Left-Direct reaction to light normal. Pupil - Right-Direct reaction to light normal.  ENMT Ears Pinna - Left - no drainage observed, no generalized tenderness observed. Right - no drainage observed, no generalized tenderness observed. Nose and Sinuses External Inspection of the Nose - no destructive lesion observed. Inspection of the nares - Left - quiet respiration. Right - quiet respiration. Mouth and Throat Lips - Upper Lip - no fissures observed, no pallor noted. Lower Lip - no fissures observed, no pallor noted. Nasopharynx - no  discharge present. Oral Cavity/Oropharynx - Tongue - no dryness observed. Oral Mucosa - no cyanosis observed. Hypopharynx - no evidence of airway distress observed.  Chest and Lung Exam Inspection Movements - Normal and Symmetrical. Accessory muscles - No use of accessory muscles in breathing. Palpation Palpation of the chest reveals - Non-tender. Auscultation Breath sounds - Normal and Clear.  Cardiovascular Auscultation Rhythm - Regular. Murmurs & Other Heart Sounds - Auscultation of the heart reveals - No Murmurs  and No Systolic Clicks.  Abdomen Inspection Inspection of the abdomen reveals - No Visible peristalsis and No Abnormal pulsations. Umbilicus - No Bleeding, No Urine drainage. Palpation/Percussion Palpation and Percussion of the abdomen reveal - Soft, Non Tender, No Rebound tenderness, No Rigidity (guarding) and No Cutaneous hyperesthesia. Note: Abdomen soft. Not severely distended. No distasis recti. No umbilical or other anterior abdominal wall hernias  Female Genitourinary Sexual Maturity Tanner 5 - Adult hair pattern. Note: No vaginal bleeding nor discharge  Rectal Note: ` ` ` Please refer to anoscopy section. Very large right anterior chronically prolapsed grade 4 hemorrhoid with grade 3 right posterior and left lateral hemorrhoids.  Peripheral Vascular Upper Extremity Inspection - Left - No Cyanotic nailbeds, Not Ischemic. Right - No Cyanotic nailbeds, Not Ischemic.  Neurologic Neurologic evaluation reveals -normal attention span and ability to concentrate, able to name objects and repeat phrases. Appropriate fund of knowledge , normal sensation and normal coordination. Mental Status Affect - not angry, not paranoid. Cranial Nerves-Normal Bilaterally. Gait-Normal.  Neuropsychiatric Mental status exam performed with findings of-able to articulate well with normal speech/language, rate, volume and coherence, thought content normal with ability  to perform basic computations and apply abstract reasoning and no evidence of hallucinations, delusions, obsessions or homicidal/suicidal ideation.  Musculoskeletal Global Assessment Spine, Ribs and Pelvis - no instability, subluxation or laxity. Right Upper Extremity - no instability, subluxation or laxity.  Lymphatic Head & Neck  General Head & Neck Lymphatics: Bilateral - Description - No Localized lymphadenopathy. Axillary  General Axillary Region: Bilateral - Description - No Localized lymphadenopathy. Femoral & Inguinal  Generalized Femoral & Inguinal Lymphatics: Left - Description - No Localized lymphadenopathy. Right - Description - No Localized lymphadenopathy.    Assessment & Plan Adin Hector MD; 11/03/2017 11:12 AM)  PROLAPSED INTERNAL HEMORRHOIDS, GRADE 4 (K64.3) Impression: Large right anterior chronically prolapsed Grade internal hemorrhoid with the other two grade 3/4.  This will not get better without surgery. I recommended hemorrhoidal ligation a pexy with excision of any remaining prolapsing tissue. At least the larger right anterior pile since it has a significant external component as well. She possibly may need the other piles removed if there is any persistent external tissue.  Did caution her that she will have pain and discomfort especially the first week or so, then it will become less. She has lived with chronic irritation and bleeding for most of her life. She is ready to consider surgery.  Current Plans ANOSCOPY, DIAGNOSTIC (85462) Pt Education - [facility] Hemorrhoids ([place] Staheli): discussed with patient and provided information. Pt Education - Pamphlet Given - The Hemorrhoid Book: discussed with patient and provided information.  EXTERNAL HEMORRHOIDS WITH COMPLICATION (V03.5) Impression: At least the right anterior pile need to be removed at the time of surgery as well.   PROLAPSED INTERNAL HEMORRHOIDS, GRADE 3 (K64.2) Impression: The anatomy & physiology  of the anorectal region was discussed. The pathophysiology of hemorrhoids and differential diagnosis was discussed. Natural history progression was discussed. I stressed the importance of a bowel regimen to have daily soft bowel movements to minimize progression of disease. Goal of one BM / day ideal. Use of wet wipes, warm baths, avoiding straining, etc were emphasized.  Educational handouts further explaining the pathology, treatment options, and bowel regimen were given as well. The patient expressed understanding.   ENCOUNTER FOR PREOPERATIVE EXAMINATION FOR GENERAL SURGICAL PROCEDURE (Z01.818)  Current Plans You are being scheduled for surgery- Our schedulers will call you.  You should hear from our  office's scheduling department within 5 working days about the location, date, and time of surgery. We try to make accommodations for patient's preferences in scheduling surgery, but sometimes the OR schedule or the surgeon's schedule prevents [place] from making those accommodations.  If you have not heard from our office [phone]) in 5 working days, call the office and ask for your surgeon's nurse.  If you have other questions about your diagnosis, plan, or surgery, call the office and ask for your surgeon's nurse.  The anatomy & physiology of the anorectal region was discussed. The pathophysiology of hemorrhoids and differential diagnosis was discussed. Natural history risks without surgery was discussed. I stressed the importance of a bowel regimen to have daily soft bowel movements to minimize progression of disease. Interventions such as sclerotherapy & banding were discussed.  The patient's symptoms are not adequately controlled by medicines and other non-operative treatments. I feel the risks & problems of no surgery outweigh the operative risks; therefore, I recommended surgery to treat the hemorrhoids by ligation, pexy, and possible resection.  Risks such as bleeding,  infection, urinary difficulties, need for further treatment, heart attack, death, and other risks were discussed. I noted a good likelihood this will help address the problem. Goals of post-operative recovery were discussed as well. Possibility that this will not correct all symptoms was explained. Post-operative pain, bleeding, constipation, and other problems after surgery were discussed. We will work to minimize complications. Educational handouts further explaining the pathology, treatment options, and bowel regimen were given as well. Questions were answered. The patient expresses understanding & wishes to proceed with surgery.  Pt Education - [facility] Rectal Prep for Anorectal outpatient/office surgery: discussed with patient and provided information. Pt Education - [facility] Rectal Surgery HCI (Inocencia Murtaugh): discussed with patient and provided information.  DIVERTICULITIS OF LARGE INTESTINE WITHOUT PERFORATION OR ABSCESS WITHOUT BLEEDING (K57.32) Impression: Questionable diagnosis of diverticulitis on a CAT scan in 2018. Some symptoms suspicious in the past couple years.  However, does not seem severely debilitating at this time. I would hold off on doing anything more aggressive unless her gastroenterologist feels like that needs to be reevaluated. I recommended a low-fat, high-fiber diet to minimize future flares.  Current Plans Pt Education - Pamphlet Given - Diverticulosis and Diverticulitis: discussed with patient and provided information.  Adin Hector, M.D., F.A.C.S. Gastrointestinal and Minimally Invasive Surgery [facility] [address] [place], [place] [address] [phone] Main / Paging

## 2017-11-04 ENCOUNTER — Ambulatory Visit (HOSPITAL_COMMUNITY)
Admission: RE | Admit: 2017-11-04 | Discharge: 2017-11-04 | Disposition: A | Discharge status: Home / Self Care | Payer: Medicare HMO | Source: Ambulatory Visit | Attending: Orthopedic Surgery | Admitting: Orthopedic Surgery

## 2017-11-04 DIAGNOSIS — M25512 Pain in left shoulder: Secondary | ICD-10-CM | POA: Diagnosis not present

## 2017-11-07 ENCOUNTER — Telehealth: Payer: Self-pay | Admitting: Radiology

## 2017-11-07 ENCOUNTER — Ambulatory Visit (INDEPENDENT_AMBULATORY_CARE_PROVIDER_SITE_OTHER): Payer: Medicare HMO | Admitting: Orthopedic Surgery

## 2017-11-07 VITALS — BP 117/63 | HR 62 | Ht 66.0 in | Wt 133.0 lb

## 2017-11-07 DIAGNOSIS — M67912 Unspecified disorder of synovium and tendon, left shoulder: Secondary | ICD-10-CM | POA: Diagnosis not present

## 2017-11-07 DIAGNOSIS — M25512 Pain in left shoulder: Secondary | ICD-10-CM | POA: Diagnosis not present

## 2017-11-07 DIAGNOSIS — M7502 Adhesive capsulitis of left shoulder: Secondary | ICD-10-CM | POA: Diagnosis not present

## 2017-11-07 NOTE — Progress Notes (Signed)
FOLLOW UP VISIT : MRI RESULTS   Chief Complaint  Patient presents with  . Results    Recheck on left shoulder, MRI results.  . Shoulder Pain     HPI: The patient is here TO DISCUSS THE RESULTS OF MRI  72 year old female with persistent and unresolving left shoulder pain status post injection no improvement went for MRI no change in symptoms   Review of Systems  Neurological: Negative for tingling and weakness.     BP 117/63   Pulse 62   Ht 5\' 6"  (1.676 m)   Wt 133 lb (60.3 kg)   BMI 21.47 kg/m     Medical decision-making section   DATA  MRI REPORT:  IMPRESSION: Mild rotator cuff tendinopathy without tear.   Findings suggestive of adhesive capsulitis.     Electronically Signed   By: Inge Rise M.D.   On: 11/04/2017 12:36      MY READING: MRI OF THE after reviewing the MRI there is no evidence rotator cuff tear   Encounter Diagnoses  Name Primary?  . Pain in joint of left shoulder   . Adhesive capsulitis of left shoulder Yes  . Tendinopathy of rotator cuff, left    Worse!   PLAN:   Recommend glenohumeral joint injection left shoulder with Depo-Medrol 40 mg lidocaine 1% 3 cc  Also recommend physical therapy and even at [facility]  Follow-up 2 weeks after shoulder injection

## 2017-11-07 NOTE — Telephone Encounter (Signed)
Call Judy Watts to schedule injection of the left shoulder

## 2017-11-11 NOTE — Telephone Encounter (Signed)
Called to schedule at [facility] June 3rd at 10:00 arrive 9:45 I have called patient to advise.

## 2017-11-17 ENCOUNTER — Ambulatory Visit (HOSPITAL_COMMUNITY)
Admission: RE | Admit: 2017-11-17 | Discharge: 2017-11-17 | Disposition: A | Discharge status: Home / Self Care | Payer: Medicare HMO | Source: Ambulatory Visit | Attending: Orthopedic Surgery | Admitting: Orthopedic Surgery

## 2017-11-17 ENCOUNTER — Encounter (HOSPITAL_COMMUNITY): Payer: Self-pay

## 2017-11-17 DIAGNOSIS — M7502 Adhesive capsulitis of left shoulder: Secondary | ICD-10-CM | POA: Diagnosis not present

## 2017-11-17 DIAGNOSIS — M25512 Pain in left shoulder: Secondary | ICD-10-CM | POA: Insufficient documentation

## 2017-11-17 MED ORDER — IOPAMIDOL (ISOVUE-300) INJECTION 61%
INTRAVENOUS | Status: AC
  Filled 2017-11-17: qty 50

## 2017-11-17 MED ORDER — METHYLPREDNISOLONE ACETATE 40 MG/ML IJ SUSP
INTRAMUSCULAR | Status: AC
  Administered 2017-11-17: 40 mg
  Filled 2017-11-17: qty 1

## 2017-11-17 MED ORDER — LIDOCAINE HCL (PF) 1 % IJ SOLN
INTRAMUSCULAR | Status: AC
  Administered 2017-11-17: 5 mL
  Filled 2017-11-17: qty 5

## 2017-11-17 MED ORDER — LIDOCAINE HCL (PF) 1 % IJ SOLN
INTRAMUSCULAR | Status: AC
  Administered 2017-11-17: 3 mL
  Filled 2017-11-17: qty 10

## 2017-11-17 MED ORDER — IOPAMIDOL (ISOVUE-300) INJECTION 61%
INTRAVENOUS | Status: AC
  Administered 2017-11-17: 4 mL
  Filled 2017-11-17: qty 30

## 2017-11-17 MED ORDER — POVIDONE-IODINE 10 % EX SOLN
CUTANEOUS | Status: AC
  Filled 2017-11-17: qty 15

## 2017-11-17 NOTE — Procedures (Signed)
Preprocedure Dx: Adhesive capsulitis LEFT shoulder Postprocedure Dx: Adhesive capsulitis LEFT shoulder Procedure  Fluoroscopically guided LEFT joint injection Radiologist:  Thornton Papas Anesthesia:  8 ml of 1% lidocaine Injectate:  2 ml of 1% lidocaine, 40 mg Depo-Medrol Fluoro time:  2 minutes 54 seconds EBL:   < 1 ml Complications: None

## 2017-11-19 DIAGNOSIS — M7502 Adhesive capsulitis of left shoulder: Secondary | ICD-10-CM | POA: Diagnosis not present

## 2017-11-19 DIAGNOSIS — M6281 Muscle weakness (generalized): Secondary | ICD-10-CM | POA: Diagnosis not present

## 2017-11-19 DIAGNOSIS — M25512 Pain in left shoulder: Secondary | ICD-10-CM | POA: Diagnosis not present

## 2017-11-19 DIAGNOSIS — M25612 Stiffness of left shoulder, not elsewhere classified: Secondary | ICD-10-CM | POA: Diagnosis not present

## 2017-11-24 ENCOUNTER — Ambulatory Visit: Payer: Medicare HMO | Admitting: Orthopedic Surgery

## 2017-11-26 ENCOUNTER — Encounter: Payer: Self-pay | Admitting: Orthopedic Surgery

## 2017-11-28 NOTE — Patient Instructions (Signed)
Judy Watts  11/28/2017   Your procedure is scheduled on: 12-05-17    Report to [facility] Main  Entrance    Report to admitting at 1:00PM    Call this number if you have problems the morning of surgery [phone]       Take these medicines the morning of surgery with A SIP OF WATER: PROPANOLOL, HYDROCODONE IF NEEDED, OMEPRAZOLE, TOPIRAMATE                                You may not have any metal on your body including hair pins and              piercings  Do not wear jewelry, make-up, lotions, powders or perfumes, deodorant             Do not wear nail polish.  Do not shave  48 hours prior to surgery.        Do not bring valuables to the hospital. [place].  Contacts, dentures or bridgework may not be worn into surgery.      Patients discharged the day of surgery will not be allowed to drive home.  Name and phone number of your driver:  Special Instructions: N/A              Please read over the following fact sheets you were given: _____________________________________________________________________  [facility] PREP INSTRUCTIONS for Anal/Rectal Surgery:  Focus on drinking liquids for 1-2 days prior to surgery Obtain what you need at a pharmacy of your choice:  A bottle of Milk of Magnesia   DAY PRIOR TO SURGERY:  Switch to drinking liquids or pureed foods only  1:00pm   Take 2 oz (8 tablespoons) Milk of Magnesia. (Repeat if no effect in 2 hours)   Midnight: Do not eat anything after midnight the night before your surgery.     MORNING OF PROCEDURE:  YOU MAY CONTINUE YOUR CLEAR LIQUID DIET UNTIL 9:00AM DAY OF SURGERY. NOTHING BY MOUTH AFTER 9:00AM! Take a Fleet enema !!!    If you have questions or problems, please call [place] [phone] speak to someone in the clinic department at our office              [place]                                                                      Foods Excluded  Coffee and tea, regular and decaf                             liquids that you cannot  Plain Jell-O in any flavor                                             see  through such as: Fruit ices (not with fruit pulp)                                     milk, soups, orange juice  Iced Popsicles                                    All solid food Carbonated beverages, regular and diet                                    Cranberry, grape and apple juices Sports drinks like Gatorade Lightly seasoned clear broth or consume(fat free) Sugar, honey syrup  Sample Menu Breakfast                                Lunch                                     Supper Cranberry juice                    Beef broth                            Chicken broth Jell-O                                     Grape juice                           Apple juice Coffee or tea                        Jell-O                                      Popsicle                                                Coffee or tea                        Coffee or tea  _____________________________________________________________________    [facility] - Preparing for Surgery Before surgery, you can play an important role.  Because skin is not sterile, your skin needs to be as free of germs as possible.  You can reduce the number of germs on your skin by washing with CHG (chlorahexidine gluconate) soap before surgery.  CHG is an antiseptic cleaner which kills germs and bonds with the skin to continue killing germs even after washing. Please DO NOT use if you have an allergy to CHG or antibacterial soaps.  If your skin becomes reddened/irritated stop using the CHG and inform your nurse when you arrive at Short Stay. Do not shave (including legs and underarms)  for at least 48 hours prior to the first CHG shower.  You may shave your face/neck. Please follow these instructions  carefully:  1.  Shower with CHG Soap the night before surgery and the  morning of Surgery.  2.  If you choose to wash your hair, wash your hair first as usual with your  normal  shampoo.  3.  After you shampoo, rinse your hair and body thoroughly to remove the  shampoo.                           4.  Use CHG as you would any other liquid soap.  You can apply chg directly  to the skin and wash                       Gently with a scrungie or clean washcloth.  5.  Apply the CHG Soap to your body ONLY FROM THE NECK DOWN.   Do not use on face/ open                           Wound or open sores. Avoid contact with eyes, ears mouth and genitals (private parts).                       Wash face,  Genitals (private parts) with your normal soap.             6.  Wash thoroughly, paying special attention to the area where your surgery  will be performed.  7.  Thoroughly rinse your body with warm water from the neck down.  8.  DO NOT shower/wash with your normal soap after using and rinsing off  the CHG Soap.                9.  Pat yourself dry with a clean towel.            10.  Wear clean pajamas.            11.  Place clean sheets on your bed the night of your first shower and do not  sleep with pets. Day of Surgery : Do not apply any lotions/deodorants the morning of surgery.  Please wear clean clothes to the hospital/surgery center.  FAILURE TO FOLLOW THESE INSTRUCTIONS MAY RESULT IN THE CANCELLATION OF YOUR SURGERY PATIENT SIGNATURE_________________________________  NURSE SIGNATURE__________________________________  ________________________________________________________________________

## 2017-12-01 ENCOUNTER — Encounter (HOSPITAL_COMMUNITY): Payer: Self-pay

## 2017-12-01 ENCOUNTER — Other Ambulatory Visit: Payer: Self-pay

## 2017-12-01 ENCOUNTER — Encounter (HOSPITAL_COMMUNITY)
Admission: RE | Admit: 2017-12-01 | Discharge: 2017-12-01 | Disposition: A | Discharge status: Home / Self Care | Payer: Medicare HMO | Source: Ambulatory Visit | Attending: Surgery | Admitting: Surgery

## 2017-12-01 DIAGNOSIS — Z01810 Encounter for preprocedural cardiovascular examination: Secondary | ICD-10-CM | POA: Diagnosis not present

## 2017-12-01 DIAGNOSIS — Z01812 Encounter for preprocedural laboratory examination: Secondary | ICD-10-CM | POA: Diagnosis not present

## 2017-12-01 DIAGNOSIS — K642 Third degree hemorrhoids: Secondary | ICD-10-CM | POA: Insufficient documentation

## 2017-12-01 DIAGNOSIS — K643 Fourth degree hemorrhoids: Secondary | ICD-10-CM | POA: Insufficient documentation

## 2017-12-01 HISTORY — DX: Unspecified injury of lower back, initial encounter: S39.92XA

## 2017-12-01 HISTORY — DX: Unspecified hemorrhoids: K64.9

## 2017-12-01 LAB — BASIC METABOLIC PANEL
Anion gap: 8 (ref 5–15)
BUN: 25 mg/dL — AB (ref 6–20)
CALCIUM: 10 mg/dL (ref 8.9–10.3)
CHLORIDE: 105 mmol/L (ref 101–111)
CO2: 31 mmol/L (ref 22–32)
CREATININE: 0.92 mg/dL (ref 0.44–1.00)
GFR calc Af Amer: >60 mL/min (ref >60)
GFR calc non Af Amer: >60 mL/min (ref >60)
Glucose, Bld: 114 mg/dL — ABNORMAL HIGH (ref 65–99)
Potassium: 3.9 mmol/L (ref 3.5–5.1)
SODIUM: 144 mmol/L (ref 135–145)

## 2017-12-01 LAB — CBC
HCT: 38 % (ref 36.0–46.0)
Hemoglobin: 13.2 g/dL (ref 12.0–15.0)
MCH: 32 pg (ref 26.0–34.0)
MCHC: 34.7 g/dL (ref 30.0–36.0)
MCV: 92 fL (ref 78.0–100.0)
Platelets: 241 10*3/uL (ref 150–400)
RBC: 4.13 MIL/uL (ref 3.87–5.11)
RDW: 13.2 % (ref 11.5–15.5)
WBC: 6.9 10*3/uL (ref 4.0–10.5)

## 2017-12-04 MED ORDER — BUPIVACAINE LIPOSOME 1.3 % IJ SUSP
20.0000 mL | INTRAMUSCULAR | Status: DC
  Filled 2017-12-04: qty 20

## 2017-12-05 ENCOUNTER — Ambulatory Visit (HOSPITAL_COMMUNITY): Payer: Medicare HMO | Admitting: Certified Registered Nurse Anesthetist

## 2017-12-05 ENCOUNTER — Encounter (HOSPITAL_COMMUNITY)
Admission: RE | Disposition: A | Discharge status: Home / Self Care | Payer: Self-pay | Source: Ambulatory Visit | Attending: Surgery

## 2017-12-05 ENCOUNTER — Ambulatory Visit (HOSPITAL_COMMUNITY)
Admission: RE | Admit: 2017-12-05 | Discharge: 2017-12-05 | Disposition: A | Discharge status: Home / Self Care | Payer: Medicare HMO | Source: Ambulatory Visit | Attending: Surgery | Admitting: Surgery

## 2017-12-05 ENCOUNTER — Encounter (HOSPITAL_COMMUNITY): Payer: Self-pay | Admitting: *Deleted

## 2017-12-05 DIAGNOSIS — Z79899 Other long term (current) drug therapy: Secondary | ICD-10-CM | POA: Insufficient documentation

## 2017-12-05 DIAGNOSIS — F1190 Opioid use, unspecified, uncomplicated: Secondary | ICD-10-CM

## 2017-12-05 DIAGNOSIS — K219 Gastro-esophageal reflux disease without esophagitis: Secondary | ICD-10-CM | POA: Diagnosis not present

## 2017-12-05 DIAGNOSIS — Z7982 Long term (current) use of aspirin: Secondary | ICD-10-CM | POA: Insufficient documentation

## 2017-12-05 DIAGNOSIS — K643 Fourth degree hemorrhoids: Secondary | ICD-10-CM | POA: Diagnosis not present

## 2017-12-05 DIAGNOSIS — K644 Residual hemorrhoidal skin tags: Secondary | ICD-10-CM | POA: Diagnosis not present

## 2017-12-05 DIAGNOSIS — I10 Essential (primary) hypertension: Secondary | ICD-10-CM | POA: Insufficient documentation

## 2017-12-05 DIAGNOSIS — K642 Third degree hemorrhoids: Secondary | ICD-10-CM | POA: Diagnosis not present

## 2017-12-05 DIAGNOSIS — K649 Unspecified hemorrhoids: Secondary | ICD-10-CM | POA: Diagnosis not present

## 2017-12-05 HISTORY — PX: EVALUATION UNDER ANESTHESIA WITH HEMORRHOIDECTOMY: SHX5624

## 2017-12-05 HISTORY — PX: PEXY: SHX6024

## 2017-12-05 SURGERY — EXAM UNDER ANESTHESIA WITH HEMORRHOIDECTOMY
Anesthesia: General

## 2017-12-05 MED ORDER — DIBUCAINE 1 % RE OINT
TOPICAL_OINTMENT | RECTAL | Status: DC | PRN
  Administered 2017-12-05: 1 via RECTAL

## 2017-12-05 MED ORDER — FENTANYL CITRATE (PF) 100 MCG/2ML IJ SOLN
INTRAMUSCULAR | Status: AC
  Filled 2017-12-05: qty 2

## 2017-12-05 MED ORDER — MIDAZOLAM HCL 2 MG/2ML IJ SOLN
INTRAMUSCULAR | Status: AC
  Filled 2017-12-05: qty 2

## 2017-12-05 MED ORDER — CEFAZOLIN SODIUM-DEXTROSE 2-4 GM/100ML-% IV SOLN
2.0000 g | INTRAVENOUS | Status: AC
  Administered 2017-12-05: 2 g via INTRAVENOUS
  Filled 2017-12-05: qty 100

## 2017-12-05 MED ORDER — FENTANYL CITRATE (PF) 100 MCG/2ML IJ SOLN: 25.0000 ug | INTRAMUSCULAR | Status: DC | PRN

## 2017-12-05 MED ORDER — PROPOFOL 10 MG/ML IV BOLUS
INTRAVENOUS | Status: DC | PRN
  Administered 2017-12-05: 100 mg via INTRAVENOUS

## 2017-12-05 MED ORDER — KETAMINE HCL 10 MG/ML IJ SOLN
INTRAMUSCULAR | Status: AC
  Filled 2017-12-05: qty 1

## 2017-12-05 MED ORDER — FENTANYL CITRATE (PF) 250 MCG/5ML IJ SOLN: INTRAMUSCULAR | Status: DC | PRN

## 2017-12-05 MED ORDER — HYDROCODONE-ACETAMINOPHEN 7.5-325 MG PO TABS: 1.0000 | ORAL_TABLET | Freq: Four times a day (QID) | ORAL | 0 refills | Status: DC | PRN

## 2017-12-05 MED ORDER — SODIUM CHLORIDE 0.9% FLUSH
3.0000 mL | Freq: Two times a day (BID) | INTRAVENOUS | Status: DC
Start: 2017-12-05 — End: 2017-12-05

## 2017-12-05 MED ORDER — LIDOCAINE 2% (20 MG/ML) 5 ML SYRINGE
INTRAMUSCULAR | Status: AC
  Filled 2017-12-05: qty 10

## 2017-12-05 MED ORDER — SODIUM CHLORIDE 0.9 % IV SOLN: 250.0000 mL | INTRAVENOUS | Status: DC | PRN

## 2017-12-05 MED ORDER — LIDOCAINE 2% (20 MG/ML) 5 ML SYRINGE
INTRAMUSCULAR | Status: DC | PRN
  Administered 2017-12-05: 1 mg/kg/h via INTRAVENOUS

## 2017-12-05 MED ORDER — BUPIVACAINE-EPINEPHRINE (PF) 0.25% -1:200000 IJ SOLN
INTRAMUSCULAR | Status: AC
  Filled 2017-12-05: qty 30

## 2017-12-05 MED ORDER — BUPIVACAINE LIPOSOME 1.3 % IJ SUSP
20.0000 mL | Freq: Once | INTRAMUSCULAR | Status: AC
  Administered 2017-12-05: 20 mL
  Filled 2017-12-05: qty 20

## 2017-12-05 MED ORDER — GABAPENTIN 300 MG PO CAPS
300.0000 mg | ORAL_CAPSULE | ORAL | Status: AC
  Administered 2017-12-05: 300 mg via ORAL
  Filled 2017-12-05: qty 1

## 2017-12-05 MED ORDER — SODIUM CHLORIDE 0.9% FLUSH: 3.0000 mL | INTRAVENOUS | Status: DC | PRN

## 2017-12-05 MED ORDER — SUGAMMADEX SODIUM 200 MG/2ML IV SOLN
INTRAVENOUS | Status: DC | PRN
  Administered 2017-12-05: 150 mg via INTRAVENOUS

## 2017-12-05 MED ORDER — 0.9 % SODIUM CHLORIDE (POUR BTL) OPTIME
TOPICAL | Status: DC | PRN
  Administered 2017-12-05: 1000 mL

## 2017-12-05 MED ORDER — EPHEDRINE SULFATE-NACL 50-0.9 MG/10ML-% IV SOSY
PREFILLED_SYRINGE | INTRAVENOUS | Status: DC | PRN
  Administered 2017-12-05 (×2): 5 mg via INTRAVENOUS
  Administered 2017-12-05: 10 mg via INTRAVENOUS

## 2017-12-05 MED ORDER — ACETAMINOPHEN 500 MG PO TABS
1000.0000 mg | ORAL_TABLET | ORAL | Status: AC
  Administered 2017-12-05: 1000 mg via ORAL
  Filled 2017-12-05: qty 2

## 2017-12-05 MED ORDER — PHENYLEPHRINE HCL 10 MG/ML IJ SOLN
INTRAMUSCULAR | Status: DC | PRN
  Administered 2017-12-05 (×2): 120 ug via INTRAVENOUS

## 2017-12-05 MED ORDER — CHLORHEXIDINE GLUCONATE CLOTH 2 % EX PADS: 6.0000 | MEDICATED_PAD | Freq: Once | CUTANEOUS | Status: DC

## 2017-12-05 MED ORDER — BUPIVACAINE-EPINEPHRINE (PF) 0.25% -1:200000 IJ SOLN
INTRAMUSCULAR | Status: DC | PRN
  Administered 2017-12-05 (×2): 20 mL

## 2017-12-05 MED ORDER — GABAPENTIN 300 MG PO CAPS: 300.0000 mg | ORAL_CAPSULE | Freq: Four times a day (QID) | ORAL | Status: DC

## 2017-12-05 MED ORDER — ROCURONIUM BROMIDE 10 MG/ML (PF) SYRINGE
PREFILLED_SYRINGE | INTRAVENOUS | Status: DC | PRN
  Administered 2017-12-05: 5 mg via INTRAVENOUS
  Administered 2017-12-05: 35 mg via INTRAVENOUS

## 2017-12-05 MED ORDER — LACTATED RINGERS IV SOLN
INTRAVENOUS | Status: DC
  Administered 2017-12-05 (×3): via INTRAVENOUS

## 2017-12-05 MED ORDER — METRONIDAZOLE IN NACL 5-0.79 MG/ML-% IV SOLN
500.0000 mg | INTRAVENOUS | Status: AC
  Administered 2017-12-05: 500 mg via INTRAVENOUS
  Filled 2017-12-05: qty 100

## 2017-12-05 MED ORDER — ONDANSETRON HCL 4 MG/2ML IJ SOLN: 4.0000 mg | Freq: Once | INTRAMUSCULAR | Status: DC | PRN

## 2017-12-05 MED ORDER — LIDOCAINE 2% (20 MG/ML) 5 ML SYRINGE
INTRAMUSCULAR | Status: DC | PRN
  Administered 2017-12-05: 60 mg via INTRAVENOUS

## 2017-12-05 MED ORDER — ACETAMINOPHEN 500 MG PO TABS: 1000.0000 mg | ORAL_TABLET | ORAL | Status: DC

## 2017-12-05 MED ORDER — PHENYLEPHRINE 40 MCG/ML (10ML) SYRINGE FOR IV PUSH (FOR BLOOD PRESSURE SUPPORT)
PREFILLED_SYRINGE | INTRAVENOUS | Status: DC | PRN
  Administered 2017-12-05: 120 ug via INTRAVENOUS

## 2017-12-05 MED ORDER — DIBUCAINE 1 % RE OINT
TOPICAL_OINTMENT | RECTAL | Status: AC
Start: 2017-12-05 — End: ?
  Filled 2017-12-05: qty 28

## 2017-12-05 MED ORDER — KETAMINE HCL 10 MG/ML IJ SOLN
INTRAMUSCULAR | Status: DC | PRN
  Administered 2017-12-05: 10 mg via INTRAVENOUS

## 2017-12-05 MED ORDER — FENTANYL CITRATE (PF) 100 MCG/2ML IJ SOLN
INTRAMUSCULAR | Status: DC | PRN
  Administered 2017-12-05: 50 ug via INTRAVENOUS
  Administered 2017-12-05 (×2): 25 ug via INTRAVENOUS

## 2017-12-05 MED ORDER — DEXAMETHASONE SODIUM PHOSPHATE 10 MG/ML IJ SOLN
INTRAMUSCULAR | Status: DC | PRN
  Administered 2017-12-05: 4 mg via INTRAVENOUS

## 2017-12-05 MED ORDER — HYDROCODONE-ACETAMINOPHEN 7.5-325 MG PO TABS: 1.0000 | ORAL_TABLET | ORAL | Status: DC | PRN

## 2017-12-05 MED ORDER — ACETAMINOPHEN 650 MG RE SUPP: 650.0000 mg | RECTAL | Status: DC | PRN

## 2017-12-05 MED ORDER — ACETAMINOPHEN 325 MG PO TABS: 650.0000 mg | ORAL_TABLET | ORAL | Status: DC | PRN

## 2017-12-05 MED ORDER — MIDAZOLAM HCL 5 MG/5ML IJ SOLN
INTRAMUSCULAR | Status: DC | PRN
  Administered 2017-12-05 (×2): 1 mg via INTRAVENOUS

## 2017-12-05 SURGICAL SUPPLY — 38 items
BENZOIN TINCTURE PRP APPL 2/3 (GAUZE/BANDAGES/DRESSINGS) ×3 IMPLANT
BLADE SURG 15 STRL LF DISP TIS (BLADE) IMPLANT
BLADE SURG 15 STRL SS (BLADE)
BRIEF STRETCH FOR OB PAD LRG (UNDERPADS AND DIAPERS) ×3 IMPLANT
CONT SPEC 4OZ CLIKSEAL STRL BL (MISCELLANEOUS) ×3 IMPLANT
COVER SURGICAL LIGHT HANDLE (MISCELLANEOUS) ×3 IMPLANT
DECANTER SPIKE VIAL GLASS SM (MISCELLANEOUS) ×3 IMPLANT
DRAPE LAPAROTOMY T 102X78X121 (DRAPES) ×3 IMPLANT
DRSG PAD ABDOMINAL 8X10 ST (GAUZE/BANDAGES/DRESSINGS) ×3 IMPLANT
ELECT PENCIL ROCKER SW 15FT (MISCELLANEOUS) ×3 IMPLANT
ELECT REM PT RETURN 15FT ADLT (MISCELLANEOUS) ×3 IMPLANT
GAUZE 4X4 16PLY RFD (DISPOSABLE) ×3 IMPLANT
GAUZE SPONGE 4X4 12PLY STRL (GAUZE/BANDAGES/DRESSINGS) ×3 IMPLANT
GLOVE BIOGEL PI IND STRL 6.5 (GLOVE) ×2 IMPLANT
GLOVE BIOGEL PI IND STRL 7.0 (GLOVE) ×2 IMPLANT
GLOVE BIOGEL PI INDICATOR 6.5 (GLOVE) ×4
GLOVE BIOGEL PI INDICATOR 7.0 (GLOVE) ×4
GLOVE ECLIPSE 8.0 STRL XLNG CF (GLOVE) ×3 IMPLANT
GLOVE INDICATOR 8.0 STRL GRN (GLOVE) ×3 IMPLANT
GOWN STRL REUS W/ TWL LRG LVL3 (GOWN DISPOSABLE) ×1 IMPLANT
GOWN STRL REUS W/TWL LRG LVL3 (GOWN DISPOSABLE) ×2
GOWN STRL REUS W/TWL XL LVL3 (GOWN DISPOSABLE) ×6 IMPLANT
KIT BASIN OR (CUSTOM PROCEDURE TRAY) ×3 IMPLANT
LOOP VESSEL MAXI BLUE (MISCELLANEOUS) IMPLANT
LUBRICANT JELLY K Y 4OZ (MISCELLANEOUS) ×3 IMPLANT
NEEDLE HYPO 22GX1.5 SAFETY (NEEDLE) ×3 IMPLANT
PACK BASIC VI WITH GOWN DISP (CUSTOM PROCEDURE TRAY) ×3 IMPLANT
SHEARS HARMONIC 9CM CVD (BLADE) IMPLANT
SUT CHROMIC 2 0 SH (SUTURE) ×6 IMPLANT
SUT CHROMIC 3 0 SH 27 (SUTURE) IMPLANT
SUT VIC AB 2-0 SH 27 (SUTURE)
SUT VIC AB 2-0 SH 27X BRD (SUTURE) IMPLANT
SUT VIC AB 2-0 UR6 27 (SUTURE) ×18 IMPLANT
SYR 20CC LL (SYRINGE) ×3 IMPLANT
TOWEL OR 17X26 10 PK STRL BLUE (TOWEL DISPOSABLE) ×3 IMPLANT
TOWEL OR NON WOVEN STRL DISP B (DISPOSABLE) ×3 IMPLANT
TRAY CATH 16FR W/PLASTIC CATH (SET/KITS/TRAYS/PACK) ×3 IMPLANT
YANKAUER SUCT BULB TIP 10FT TU (MISCELLANEOUS) ×3 IMPLANT

## 2017-12-05 NOTE — Discharge Instructions (Signed)
ANORECTAL SURGERY:  °POST OPERATIVE INSTRUCTIONS ° °###################################################################### ° °EAT °Start with a pureed / full liquid diet °After 24 hours, gradually transition to a high fiber diet.   ° °CONTROL PAIN °Control pain so you can tolerate bowel movements,  °walk, sleep, tolerate sneezing/coughing, and go up/down stairs. ° ° °HAVE A BOWEL MOVEMENT DAILY °Keep your bowels regular to avoid problems.   °Taking a fiber supplement every day to keep bowels soft.   °Try a laxative to override constipation. °Use an antidairrheal to slow down diarrhea.   °Call if not better after 2 tries ° °WALK °Walk an hour a day.  Control your pain to do that. °  °CALL IF YOU HAVE PROBLEMS/CONCERNS °Call if you are still struggling despite following these instructions. °Call if you have concerns not answered by these instructions ° °###################################################################### ° ° ° °1. Take your usually prescribed home medications unless otherwise directed. °2. DIET: Follow a light bland diet the first 24 hours after arrival home, such as soup, liquids, crackers, etc.  Be sure to include lots of fluids daily.  Avoid fast food or heavy meals as your are more likely to get nauseated.  Eat a low fat the next few days after surgery.   °3. PAIN CONTROL: °a. Pain is best controlled by a usual combination of three different methods TOGETHER: °i. Ice/Heat °ii. Over the counter pain medication °iii. Prescription pain medication °b. Expect swelling and discomfort in the anus/rectal area.  Warm water baths (30-60 minutes up to 6 times a day, especially after bowel meovements) will help. Use ice for the first few days to help decrease swelling and bruising, then switch to heat such as warm towels, sitz baths, warm baths, etc to help relax tight/sore spots and speed recovery.  Some people prefer to use ice alone, heat alone, alternating between ice & heat.  Experiment to what works  for you.   °c. It is helpful to take an over-the-counter pain medication continuously for the first few weeks.  Choose one of the following that works best for you: °i. Naproxen (Aleve, etc)  Two 220mg tabs twice a day °ii. Ibuprofen (Advil, etc) Three 200mg tabs four times a day (every meal & bedtime) °iii. Acetaminophen (Tylenol, etc) 500-650mg four times a day (every meal & bedtime) °d. A  prescription for pain medication (such as oxycodone, hydrocodone, etc) should be given to you upon discharge.  Take your pain medication as prescribed.  °i. If you are having problems/concerns with the prescription medicine (does not control pain, nausea, vomiting, rash, itching, etc), please call us [phone] to see if we need to switch you to a different pain medicine that will work better for you and/or control your side effect better. °ii. If you need a refill on your pain medication, please contact your pharmacy.  They will contact our office to request authorization. Prescriptions will not be filled after 5 pm or on week-ends.  If can take up to 48 hours for it to be filled & ready so avoid waiting until you are down to thel ast pill. °e. A topical cream (Dibucaine) or a prescription for a cream (such as diltiazem 2% gel) may be given to you.  Many people find relief with topical creams.  Some people find it burns too much.  Experiment.  If it helps, use it.  If it burns, don't using it. ° °Use a Sitz Bath 4-8 times a day for relief ° ° °Sitz Bath °A sitz bath   is a warm water bath taken in the sitting position that covers only the hips and buttocks. It may be used for either healing or hygiene purposes. Sitz baths are also used to relieve pain, itching, or muscle spasms. The water may contain medicine. Moist heat will help you heal and relax.  °HOME CARE INSTRUCTIONS  °Take 3 to 4 sitz baths a day. °1. Fill the bathtub half full with warm water. °2. Sit in the water and open the drain a little. °3. Turn on the warm  water to keep the tub half full. Keep the water running constantly. °4. Soak in the water for 15 to 20 minutes. °5. After the sitz bath, pat the affected area dry first. ° ° °4. KEEP YOUR BOWELS REGULAR °a. The goal is one soft bowel movement a day °b. Avoid getting constipated.  Between the surgery and the pain medications, it is common to experience some constipation.  Increasing fluid intake and taking a fiber supplement (such as Metamucil, Citrucel, FiberCon, MiraLax, etc) 2-3 times a day regularly will usually help prevent this problem from occurring.  A mild laxative (prune juice, Milk of Magnesia, MiraLax, etc) should be taken according to package directions if there are no bowel movements after 48 hours. °c. Watch out for diarrhea.  If you have many loose bowel movements, simplify your diet to bland foods & liquids for a few days.  Stop any stool softeners and decrease your fiber supplement.  Switching to mild anti-diarrheal medications (Kayopectate, Pepto Bismol) can help.  Can try an imodium/loperamide dose.  If this worsens or does not improve, please call us. ° °5. Wound Care ° °a. Remove your bandages with your first bowel movement, usually the day after surgery.  You may have packing if you had an abscess.  Let any packing or gauze fall come out.   °b. Wear an absorbent pad or soft cotton balls in your underwear as needed to catch any drainage and help keep the area  °c. Keep the area clean and dry.  Bathe / shower every day.  Keep the area clean by showering / bathing over the incision / wound.   It is okay to soak an open wound to help wash it.  Consider using a squeeze bottle filled with warm water to gently wash the anal area.  Wet wipes or showers / gentle washing after bowel movements is often less traumatic than regular toilet paper. °d. You will often notice bleeding with bowel movements.  This should slow down by the end of the first week of surgery.  Sitting on an ice pack can  help. °e. Expect some drainage.  This should slow down by the end of the first week of surgery, but you will have occasional bleeding or drainage up to a few months after surgery.  Wear an absorbent pad or soft cotton gauze in your underwear until the drainage stops. ° °6. ACTIVITIES as tolerated:   °a. You may resume regular (light) daily activities beginning the next day--such as daily self-care, walking, climbing stairs--gradually increasing activities as tolerated.  If you can walk 30 minutes without difficulty, it is safe to try more intense activity such as jogging, treadmill, bicycling, low-impact aerobics, swimming, etc. °b. Save the most intensive and strenuous activity for last such as sit-ups, heavy lifting, contact sports, etc  Refrain from any heavy lifting or straining until you are off narcotics for pain control.   °c. DO NOT PUSH THROUGH PAIN.  Let pain   be your guide: If it hurts to do something, don't do it.  Pain is your body warning you to avoid that activity for another week until the pain goes down. °d. You may drive when you are no longer taking prescription pain medication, you can comfortably sit for long periods of time, and you can safely maneuver your car and apply brakes. °e. You may have sexual intercourse when it is comfortable.  °7. FOLLOW UP in our office °a. Please call [facility] at [phone] to set up an appointment to see your surgeon in the office for a follow-up appointment approximately 2-3 weeks after your surgery. °b. Make sure that you call for this appointment the day you arrive home to ensure a convenient appointment time. ° °8. IF YOU HAVE DISABILITY OR FAMILY LEAVE FORMS, BRING THEM TO THE OFFICE FOR PROCESSING.  DO NOT GIVE THEM TO YOUR DOCTOR. ° ° ° ° ° ° ° °WHEN TO CALL US [phone]: °1. Poor pain control °2. Reactions / problems with new medications (rash/itching, nausea, etc)  °3. Fever over 101.5 F (38.5 C) °4. Inability to urinate °5. Nausea and/or  vomiting °6. Worsening swelling or bruising °7. Continued bleeding from incision. °8. Increased pain, redness, or drainage from the incision ° °The clinic staff is available to answer your questions during regular business hours (8:30am-5pm).  Please don’t hesitate to call and ask to speak to one of our nurses for clinical concerns.   A surgeon from [facility] is always on call at the hospitals °  °If you have a medical emergency, go to the nearest emergency room or call 911. °  ° °[facility] °[address], [address], [place], [place]  [address] ? °MAIN: [phone] ? TOLL FREE: [phone] ? °FAX [phone] °[email or web address] ° ° °HEMORRHOIDS  °The rectum is the last foot of your colon, and it naturally stretches to hold stool.  Hemorrhoidal piles are natural clusters of blood vessels that help the rectum and anal canal stretch to hold stool and allow bowel movements to eliminate feces.   °Hemorrhoids are abnormally swollen blood vessels in the rectum.  Too much pressure in the rectum causes hemorrhoids by forcing blood to stretch and bulge the walls of the veins, sometimes even rupturing them.  Hemorrhoids can become like varicose veins you might see on a person's legs.  °Most people will develop a flare of hemorrhoids in their lifetime.  When bulging hemorrhoidal veins are irritated, they can swell, burn, itch, cause pain, and bleed.  Most flares will calm down gradually own within a few weeks.  However, once hemorrhoids are created, they are difficult to get rid of completely and tend to flare more easily than the first flare.   Fortunately, good habits and simple medical treatment usually control hemorrhoids well, and surgery is needed only in severe cases. °Types of Hemorrhoids:  °Internal hemorrhoids usually don't initially hurt or itch; they are deep inside the rectum and usually have no sensation. If they begin to push out (prolapse), pain and  burning can occur.  However, internal hemorrhoids can bleed.  Anal bleeding should not be ignored since bleeding could come from a dangerous source like colorectal cancer, so persistent rectal bleeding should be investigated by a doctor, sometimes with a colonoscopy.  °External hemorrhoids cause most of the symptoms - pain, burning, and itching. Nonirritated hemorrhoids can look like small skin tags coming out of the anus.   °Thrombosed hemorrhoids can form   when a hemorrhoid blood vessel bursts and causes the hemorrhoid to suddenly swell.  A purple blood clot can form in it and become an excruciatingly painful lump at the anus. Because of these unpleasant symptoms, immediate incision and drainage by a surgeon at an office visit can provide much relief of the pain.   ° °PREVENTION °Avoiding the most frequent causes listed below will prevent most cases of hemorrhoids: °Constipation °Hard stools °Diarrhea  °Constant sitting  °Straining with bowel movements °Sitting on the toilet for a long time  °Severe coughing  episodes °Pregnancy / Childbirth  °Heavy Lifting  °Sometimes avoiding the above triggers is difficult:  How can you avoid sitting all day if you have a seated job? Also, we try to avoid coughing and diarrhea, but sometimes it’s beyond your control.  Still, there are some practical hints to help: °Keep the anal and genital area clean.  Moistened tissues such as flushable wet wipes are less irritating than toilet paper.  Using irrigating showers or bottle irrigation washing gently cleans this sensitive area.   Avoid dry toilet paper when cleaning after bowel movements.  . °Keep the anal and genital area dry.  Lightly pat the rectal area dry.  Avoid rubbing.  Talcum or baby powders can help °GET YOUR STOOLS SOFT.   This is the most important way to prevent irritated hemorrhoids.  Hard stools are like sandpaper to the anorectal canal and will cause more problems.  The goal: ONE SOFT BOWEL MOVEMENT A DAY!  BMs from  every other day to 3 times a day is a tolerable range °Treat coughing, diarrhea and constipation early since irritated hemorrhoids may soon follow.  °If your main job activity is seated, always stand or walk during your breaks. Make it a point to stand and walk at least 5 minutes every hour and try to shift frequently in your chair to avoid direct rectal pressure.  °Always exhale as you strain or lift. Don't hold your breath.  °Do not delay or try to prevent a bowel movement when the urge is present. °Exercise regularly (walking or jogging 60 minutes a day) to stimulate the bowels to move. °No reading or other activity while on the toilet. If bowel movements take longer than 5 minutes, you are too constipated. °AVOID CONSTIPATION °Drink plenty of liquids (1 1/2 to 2 quarts of water and other fluids a day unless fluid restricted for another medical condition). Liquids that contain caffeine (coffee a, tea, soft drinks) can be dehydrating and should be avoided until constipation is controlled. Consider minimizing milk, as dairy products may be constipating. °Eat plenty of fiber (30g a day ideal, more if needed).  Fiber is the undigested part of plant food that passes into the colon, acting as “natures broom” to encourage bowel motility and movement.  Fiber can absorb and hold large amounts of water. This results in a larger, bulkier stool, which is soft and easier to pass.  °Eating foods high in fiber - 12 servings - such as  °Vegetables: Root (potatoes, carrots, turnips), Leafy green (lettuce, salad greens, celery, spinach), High residue (cabbage, broccoli, etc.) °Fruit: Fresh, Dried (prunes, apricots, cherries), Stewed (applesauce)  °Whole grain breads, pasta, whole wheat °Bran cereals, muffins, etc. °Consider adding supplemental bulking fiber which retains large volumes of water: °Psyllium ground seeds (native plant from [place])--available as Metamucil, Konsyl, Effersyllium, Per Diem Fiber, or the less  expensive generic forms.  °Citrucel  (methylcellulose wood fiber) . °FiberCon (Polycarbophil) °Polyethylene Glycol - and “  artificial” fiber commonly called Miralax or Glycolax.  It is helpful for people with gassy or bloated feelings with regular fiber °Flax Seed - a less gassy natural fiber  °Laxatives can be useful for a short period if constipation is severe °Osmotics (Milk of Magnesia, Fleets Phospho-Soda, Magnesium Citrate)  °Stimulants (Senokot,   Castor Oil,  Dulcolax, Ex-Lax)    °Laxatives are not a good long-term solution as it can stress the bowels and cause too much mineral loss and dehydration.   Avoid taking laxatives for more than 7 days in a row. ° °AVOID DIARRHEA °Switch to liquids and simpler foods for a few days to avoid stressing your intestines further. °Avoid dairy products (especially milk & ice cream) for a short time.  The intestines often can lose the ability to digest lactose when stressed. °Avoid foods that cause gassiness or bloating.  Typical foods include beans and other legumes, cabbage, broccoli, and dairy foods.  Every person has some sensitivity to other foods, so listen to your body and avoid those foods that trigger problems for you. °Adding fiber (Citrucel, Metamucil, FiberCon, Flax seed, Miralax) gradually can help thicken stools by absorbing excess fluid and retrain the intestines to act more normally.  Slowly increase the dose over a few weeks.  Too much fiber too soon can backfire and cause cramping & bloating. °Probiotics (such as active yogurt, Align, etc) may help repopulate the intestines and colon with normal bacteria and calm down a sensitive digestive tract.  Most studies show it to be of mild help, though, and such products can be costly. °Medicines: °Bismuth subsalicylate (ex. Kayopectate, Pepto Bismol) every 30 minutes for up to 6 doses can help control diarrhea.  Avoid if pregnant. °Loperamide (Immodium) can slow down diarrhea.  Start with two tablets (4mg total)  first and then try one tablet every 6 hours.  Avoid if you are having fevers or severe pain.  If you are not better or start feeling worse, stop all medicines and call your doctor for advice °Call your doctor if you are getting worse or not better.  Sometimes further testing (cultures, endoscopy, X-ray studies, bloodwork, etc) may be needed to help diagnose and treat the cause of the diarrhea. ° °TROUBLESHOOTING IRREGULAR BOWELS °1) Avoid extremes of bowel movements (no bad constipation/diarrhea) °2) Miralax 17gm mixed in 8oz. water or juice-daily. May use BID as needed.  °3) Gas-x,Phazyme, etc. as needed for gas & bloating.  °4) Soft,bland diet. No spicy,greasy,fried foods.  °5) Prilosec over-the-counter as needed  °6) May hold gluten/wheat products from diet to see if symptoms improve.  °7)  May try probiotics (Align, Activa, etc) to help calm the bowels down °7) If symptoms become worse call back immediately. ° ° °TREATMENT OF HEMORRHOID FLARE °If these preventive measures fail, you must take action right away! Hemorrhoids are one condition that can be mild in the morning and become intolerable by nightfall. °Most hemorrhoidal flares take several weeks to calm down.  These suggestions can help: °Warm soaks.  This helps more than any topical medication.  Use up to 8 times a day.  Usually sitz baths or sitting in a warm bathtub helps.  Sitting on moist warm towels are helpful.  Switching to ice packs/cool compresses can be helpful ° °Use a Sitz Bath 4-8 times a day for relief °A sitz bath is a warm water bath taken in the sitting position that covers only the hips and buttocks. It may be used for either healing or hygiene   purposes. Sitz baths are also used to relieve pain, itching, or muscle spasms. The water may contain medicine. Moist heat will help you heal and relax.  °HOME CARE INSTRUCTIONS  °Take 3 to 4 sitz baths a day. °1. Fill the bathtub half full with warm water. °2. Sit in the water and open the drain a  little. °3. Turn on the warm water to keep the tub half full. Keep the water running constantly. °4. Soak in the water for 15 to 20 minutes. °5. After the sitz bath, pat the affected area dry first. °SEEK MEDICAL CARE IF:  °You get worse instead of better. Stop the sitz baths if you get worse. ° °Normalize your bowels.  Extremes of diarrhea or constipation will make hemorrhoids worse.  One soft bowel movement a day is the goal.  Fiber can help get your bowels regular °Wet wipes instead of toilet paper °Pain control with a NSAID such as ibuprofen (Advil) or naproxen (Aleve) or acetaminophen (Tylenol) around the clock.  Narcotics are constipating and should be minimized if possible °Topical creams contain steroids (bydrocortisone) or local anesthetic (xylocaine) can help make pain and itching more tolerable.   °EVALUATION °If hemorrhoids are still causing problems, you could benefit by an evaluation by a surgeon.  The surgeon will obtain a history and examine you.  If hemorrhoids are diagnosed, some therapies can be offered in the office, usually with an anoscope into the less sensitive area of the rectum: °-injection of hemorrhoids (sclerotherapy) can scar the blood vessels of the swollen/enlarged hemorrhoids to help shrink them down to a more normal size °-rubber banding of the enlarged hemorrhoids to help shrink them down to a more normal size °-drainage of the blood clot causing a thrombosed hemorrhoid,  to relieve the severe pain  ° °While 90% of the time such problems from hemorrhoids can be managed without preceding to surgery, sometimes the hemorrhoids require a operation to control the problem (uncontrolled bleeding, prolapse, pain, etc.).   This involves being placed under general anesthesia where the surgeon can confirm the diagnosis and remove, suture, or staple the hemorrhoid(s).  Your surgeon can help you treat the problem appropriately.   ° °

## 2017-12-05 NOTE — Transfer of Care (Signed)
Immediate Anesthesia Transfer of Care Note  Patient: Judy Watts  Procedure(s) Performed: ANORECTAL EXAM UNDER ANESTHESIA WITH HEMORRHOIDECTOMY (N/A ) HEMORRHOIDAL LIGATION/PEXY (N/A )  Patient Location: PACU  Anesthesia Type:General  Level of Consciousness: sedated  Airway & Oxygen Therapy: Patient Spontanous Breathing and Patient connected to face mask oxygen  Post-op Assessment: Report given to RN and Post -op Vital signs reviewed and stable  Post vital signs: Reviewed and stable  Last Vitals:  Vitals Value Taken Time  BP 124/68 12/05/2017  5:25 PM  Temp    Pulse 86 12/05/2017  5:26 PM  Resp 18 12/05/2017  5:26 PM  SpO2 100 % 12/05/2017  5:26 PM  Vitals shown include unvalidated device data.  Last Pain:  Vitals:   12/05/17 1317  PainSc: 0-No pain         Complications: No apparent anesthesia complications

## 2017-12-05 NOTE — Op Note (Signed)
12/05/2017  5:05 PM  PATIENT:  Judy Watts  72 y.o. female  Patient Care Team: Celene Squibb, MD as PCP - General (Internal Medicine) Rogene Houston, MD as Consulting Physician (Gastroenterology) Michael Boston, MD as Consulting Physician (General Surgery) Otis Brace, MD as Consulting Physician (Gastroenterology)  PRE-OPERATIVE DIAGNOSIS:  Hemorrhoids prolapsing grade 3 & 4 with bleeding and pain  POST-OPERATIVE DIAGNOSIS:  Hemorrhoids prolapsing grade 3 & 4 with bleeding and pain  PROCEDURE:   Internal and external hemorrhoidectomy  x3 Internal hemorrhoidal ligation and pexy Anorectal examination under anesthesia  SURGEON:  Adin Hector, MD  ANESTHESIA:   General Anorectal & Local field block  0.25% bupivacaine with epinephrine at the beginning of the case. Liposomal bupivacaine (Experel) at the end of the case.  EBL:  Total I/O In: 1000 [I.V.:1000] Out: 50 [Blood:50].  See operative record  Delay start of Pharmacological VTE agent (>24hrs) due to surgical blood loss or risk of bleeding:  NO  DRAINS: NONE  SPECIMEN:   Internal & external hemorrhoid x 3 piles  DISPOSITION OF SPECIMEN:  PATHOLOGY  COUNTS:  YES  PLAN OF CARE: Discharge home after PACU  PATIENT DISPOSITION:  PACU - hemodynamically stable.  INDICATION: Pleasant patient with struggles with hemorrhoids.  Not able to be managed in the office despite an improved bowel regimen.  I recommended examination under anesthesia and surgical treatment:  The anatomy & physiology of the anorectal region was discussed.  The pathophysiology of hemorrhoids and differential diagnosis was discussed.  Natural history risks without surgery was discussed.   I stressed the importance of a bowel regimen to have daily soft bowel movements to minimize progression of disease.  Interventions such as sclerotherapy & banding were discussed.  The patient's symptoms are not adequately controlled by medicines and other  non-operative treatments.  I feel the risks & problems of no surgery outweigh the operative risks; therefore, I recommended surgery to treat the hemorrhoids by ligation, pexy, and possible resection.  Risks such as bleeding, infection, need for further treatment, heart attack, death, and other risks were discussed.   I noted a good likelihood this will help address the problem.  Goals of post-operative recovery were discussed as well.  Possibility that this will not correct all symptoms was explained.  Post-operative pain, bleeding, constipation, urinary difficulties, and other problems after surgery were discussed.  We will work to minimize complications.   Educational handouts further explaining the pathology, treatment options, and bowel regimen were given as well.  Questions were answered.  The patient expresses understanding & wishes to proceed with surgery.  OR FINDINGS: Giant right anterior chronically prolapsed hemorrhoid with grade 3 right posterior and grade 4 left lateral internal hemorrhoids with external components.  Right anterior  > left lateral > right posterior piles.  Redundant rectum but not true proximal edition she has circumferential prolapse.  Decreased but intact sphincter tone.  No other abnormalities.  DESCRIPTION:   Informed consent was confirmed. Patient underwent general anesthesia without difficulty. Patient was placed into prone positioning.  The perianal region was prepped and draped in sterile fashion. Surgical time-out confirmed our plan.  I did digital rectal examination and then transitioned over to anoscopy to get a sense of the anatomy.  Findings noted above.   I proceeded to do hemorrhoidal ligation and pexy.  I used a 2-0 Vicryl suture on a UR-6 needle in a figure-of-eight fashion 6 cm proximal to the anal verge.  I started at the largest  right anterior hemorrhoid pile.  Because of redundant hemorrhoidal tissue too bulky to merely ligate or pexy, I excised the  excess internal hemorrhoid piles longitudinally in a fusiform biconcave fashion, at the  right anterior, right posterior and left lateral locations, sparing the anal canal to avoid narrowing.  Narrow excisions on the right posterior and left lateral piles but excised nonetheless given chronic thickening.   I then ran that stitch longitudinally more distally to close the hemorrhoidectomy wound to the anal verge over a Parks self retaining retractor & occasionally a large Hill-Furgeson retractor to avoid narrowing of the anal canal.  I then tied that stitch down to cause a hemorrhoidopexy.   I then did hemorrhoidal ligation and pexy at the other 3 columns.  At the completion of this, all 6 anorectal columns were ligated and pexied in the classic hexagonal fashion (right anterior/lateral/posterior, left anterior/lateral/posterior).  I closed the external part of the hemorrhoidectomy wounds with interrupted horizontal mattress 2-0 chromic suture, leaving the last 5 mm open to allow natural drainage.    I redid anoscopy & examination.  At completion of this, all hemorrhoids had been removed or reduced into the rectum.  There is no more prolapse.  Internal & external anatomy was more more normal.  Hemostasis was good.  Fluffed gauze was on-laid over the perianal region.  No packing done.  Patient is being extubated go to go to the recovery room.  I had discussed postop care in detail with the patient in the preop holding area.  Instructions for post-operative recovery and prescriptions are written. I discussed operative findings, updated the patient's status, discussed probable steps to recovery, and gave postoperative recommendations to the patient's spouse.  Recommendations were made.  Questions were answered.  He expressed understanding & appreciation.  Adin Hector, M.D., F.A.C.S. Gastrointestinal and Minimally Invasive Surgery [facility] [address] [place], [place]  [phone] Main / Paging

## 2017-12-05 NOTE — Anesthesia Procedure Notes (Signed)
Date/Time: 12/05/2017 5:18 PM Performed by: Cynda Familia, CRNA Oxygen Delivery Method: Simple face mask Placement Confirmation: breath sounds checked- equal and bilateral and positive ETCO2 Dental Injury: Teeth and Oropharynx as per pre-operative assessment

## 2017-12-05 NOTE — H&P (Signed)
Judy Watts DOB: May 15, 1946  Patient Care Team: Celene Squibb, MD as PCP - General (Internal Medicine) Rogene Houston, MD as Consulting Physician (Gastroenterology) Michael Boston, MD as Consulting Physician (General Surgery) Otis Brace, MD as Consulting Physician (Gastroenterology)   Patient sent for surgical consultation at the request of Dr. Nevada Crane  Chief Complaint: Worsening prolapsed hemorrhoids  The patient is a pleasant active woman that is struggle with hemorrhoids since she was a teenager. She noted she did a lot of heavy lifting [place]. She had 3 kids as well. Vaginal deliveries. He notes that she is struggle with some irregular bowels from time but moves her bowels usually a couple times a day now. More recently she's had some crampy abdominal pain suspicious for mild diverticulitis. She thinks she's had at least a few tacks 2017/2018. Not this year. She was managed by Dr. Laural Golden and had a colonoscopy in 2017 that showed some sigmoid diverticulosis as well as some internal/external hemorrhoids. Recently has been under the care of Dr. Alessandra Bevels with [facility] gastroenterology. She notes that she feels hemorrhoids are out all the time now. Constantly having to wear a pad. Bleeding and irritating. Rather comfortable. For most of her life, she has been dissuaded from surgery. However, she had a friend who had hemorrhoid surgery and got through it fine and recommended the patient reconsider.  No personal nor family history of GI/colon cancer, inflammatory bowel disease, irritable bowel syndrome, allergy such as Celiac Sprue, dietary/dairy problems, colitis, ulcers nor gastritis. No recent sick contacts/gastroenteritis. No travel outside the country. No changes in diet. No dysphagia to solids or liquids. No significant heartburn or reflux. No hematochezia, hematemesis, coffee ground emesis. No evidence of prior gastric/peptic ulceration. She  walk a few miles without difficulty. No heart or lung issues. She does not smoke. No alcohol. She had a tubal ligation after her last pregnancy but no other abdominal surgeries.  (Review of systems as stated in this history (HPI) or in the review of systems. Otherwise all other 12 point ROS are negative) ` ` `   Diagnostic Studies History Illene Regulus, CMA; 11/03/2017 10:16 AM) Colonoscopy 1-5 years ago Mammogram within last year Pap Smear 1-5 years ago  Allergies Illene Regulus, CMA; 11/03/2017 10:17 AM) No Known Drug Allergies [11/03/2017]:  Medication History (Alisha Spillers, CMA; 11/03/2017 10:20 AM) Randel Books Aspirin (81MG  Tablet Chewable, Oral) Active. Azelastine HCl (0.1% Solution, Nasal) Active. Diclofenac Sodium (75MG  Tablet DR, Oral) Active. Gabapentin (300MG  Capsule, Oral) Active. HydroCHLOROthiazide (25MG  Tablet, Oral) Active. Hydrocodone-Acetaminophen (7.5-325MG  Tablet, Oral) Active. Montelukast Sodium (10MG  Tablet, Oral) Active. Pantoprazole Sodium (40MG  Tablet DR, Oral) Active. Propranolol HCl (80MG  Tablet, Oral) Active. Topiramate (25MG  Tablet, Oral) Active. Dicyclomine HCl (10MG  Capsule, Oral) Active. Loratadine (10MG  Tablet, Oral) Active. Centrum Silver (Oral) Active. Omega 3-6-9 (Oral) Active. Medications Reconciled  Social History Illene Regulus, CMA; 11/03/2017 10:16 AM) No alcohol use No caffeine use No drug use Tobacco use Never smoker.  Family History Illene Regulus, CMA; 11/03/2017 10:16 AM) Malignant Neoplasm Of Pancreas Family Members In General. Migraine Headache Daughter, Mother.  Pregnancy / Birth History Illene Regulus, CMA; 11/03/2017 10:16 AM) Age at menarche 72 years. Age of menopause 51-55 Contraceptive History Oral contraceptives. Gravida 7 Maternal age 32-20 Para 3  Other Problems Illene Regulus, CMA; 11/03/2017 10:16 AM) Back Pain Gastroesophageal Reflux  Disease Hemorrhoids High blood pressure Migraine Headache     Review of Systems (Alisha Spillers CMA; 11/03/2017 10:16 AM) General Not Present- Appetite Loss,  Chills, Fatigue, Fever, Night Sweats, Weight Gain and Weight Loss. Skin Not Present- Change in Wart/Mole, Dryness, Hives, Jaundice, New Lesions, Non-Healing Wounds, Rash and Ulcer. HEENT Not Present- Earache, Hearing Loss, Hoarseness, Nose Bleed, Oral Ulcers, Ringing in the Ears, Seasonal Allergies, Sinus Pain, Sore Throat, Visual Disturbances, Wears glasses/contact lenses and Yellow Eyes. Respiratory Not Present- Bloody sputum, Chronic Cough, Difficulty Breathing, Snoring and Wheezing. Breast Not Present- Breast Mass, Breast Pain, Nipple Discharge and Skin Changes. Cardiovascular Not Present- Chest Pain, Difficulty Breathing Lying Down, Leg Cramps, Palpitations, Rapid Heart Rate, Shortness of Breath and Swelling of Extremities. Gastrointestinal Present- Hemorrhoids and Rectal Pain. Not Present- Abdominal Pain, Bloating, Bloody Stool, Change in Bowel Habits, Chronic diarrhea, Constipation, Difficulty Swallowing, Excessive gas, Gets full quickly at meals, Indigestion, Nausea and Vomiting. Female Genitourinary Not Present- Frequency, Nocturia, Painful Urination, Pelvic Pain and Urgency. Musculoskeletal Not Present- Back Pain, Joint Pain, Joint Stiffness, Muscle Pain, Muscle Weakness and Swelling of Extremities. Neurological Not Present- Decreased Memory, Fainting, Headaches, Numbness, Seizures, Tingling, Tremor, Trouble walking and Weakness. Psychiatric Not Present- Anxiety, Bipolar, Change in Sleep Pattern, Depression, Fearful and Frequent crying. Endocrine Not Present- Cold Intolerance, Excessive Hunger, Hair Changes, Heat Intolerance, Hot flashes and New Diabetes. Hematology Not Present- Blood Thinners, Easy Bruising, Excessive bleeding, Gland problems, HIV and Persistent Infections.  Vitals (Alisha Spillers CMA; 11/03/2017 10:17  AM) 11/03/2017 10:17 AM Weight: 133.8 lb Height: 66in Body Surface Area: 1.69 m Body Mass Index: 21.6 kg/m  Pulse: 60 (Regular)  BP: 118/74 (Sitting, Left Arm, Standard)  BP (!) 143/99   Pulse 84   Temp 98.1 F (36.7 C)   Resp 18   Ht 5\' 6"  (1.676 m)   Wt 58.5 kg (129 lb)   SpO2 98%   BMI 20.82 kg/m      Physical Exam Adin Hector MD; 11/03/2017 10:36 AM)  General Mental Status-Alert. General Appearance-Not in acute distress, Not Sickly. Orientation-Oriented X3. Hydration-Well hydrated. Voice-Normal.  Integumentary Global Assessment Upon inspection and palpation of skin surfaces of the - Axillae: non-tender, no inflammation or ulceration, no drainage. and Distribution of scalp and body hair is normal. General Characteristics Temperature - normal warmth is noted.  Head and Neck Head-normocephalic, atraumatic with no lesions or palpable masses. Face Global Assessment - atraumatic, no absence of expression. Neck Global Assessment - no abnormal movements, no bruit auscultated on the right, no bruit auscultated on the left, no decreased range of motion, non-tender. Trachea-midline. Thyroid Gland Characteristics - non-tender.  Eye Eyeball - Left-Extraocular movements intact, No Nystagmus. Eyeball - Right-Extraocular movements intact, No Nystagmus. Cornea - Left-No Hazy. Cornea - Right-No Hazy. Sclera/Conjunctiva - Left-No scleral icterus, No Discharge. Sclera/Conjunctiva - Right-No scleral icterus, No Discharge. Pupil - Left-Direct reaction to light normal. Pupil - Right-Direct reaction to light normal.  ENMT Ears Pinna - Left - no drainage observed, no generalized tenderness observed. Right - no drainage observed, no generalized tenderness observed. Nose and Sinuses External Inspection of the Nose - no destructive lesion observed. Inspection of the nares - Left - quiet respiration. Right - quiet  respiration. Mouth and Throat Lips - Upper Lip - no fissures observed, no pallor noted. Lower Lip - no fissures observed, no pallor noted. Nasopharynx - no discharge present. Oral Cavity/Oropharynx - Tongue - no dryness observed. Oral Mucosa - no cyanosis observed. Hypopharynx - no evidence of airway distress observed.  Chest and Lung Exam Inspection Movements - Normal and Symmetrical. Accessory muscles - No use of accessory muscles in breathing. Palpation Palpation of  the chest reveals - Non-tender. Auscultation Breath sounds - Normal and Clear.  Cardiovascular Auscultation Rhythm - Regular. Murmurs & Other Heart Sounds - Auscultation of the heart reveals - No Murmurs and No Systolic Clicks.  Abdomen Inspection Inspection of the abdomen reveals - No Visible peristalsis and No Abnormal pulsations. Umbilicus - No Bleeding, No Urine drainage. Palpation/Percussion Palpation and Percussion of the abdomen reveal - Soft, Non Tender, No Rebound tenderness, No Rigidity (guarding) and No Cutaneous hyperesthesia. Note: Abdomen soft. Not severely distended. No distasis recti. No umbilical or other anterior abdominal wall hernias  Female Genitourinary Sexual Maturity Tanner 5 - Adult hair pattern. Note: No vaginal bleeding nor discharge  Rectal Note: ` ` ` Please refer to anoscopy section. Very large right anterior chronically prolapsed grade 4 hemorrhoid with grade 3 right posterior and left lateral hemorrhoids.  Peripheral Vascular Upper Extremity Inspection - Left - No Cyanotic nailbeds, Not Ischemic. Right - No Cyanotic nailbeds, Not Ischemic.  Neurologic Neurologic evaluation reveals -normal attention span and ability to concentrate, able to name objects and repeat phrases. Appropriate fund of knowledge , normal sensation and normal coordination. Mental Status Affect - not angry, not paranoid. Cranial Nerves-Normal  Bilaterally. Gait-Normal.  Neuropsychiatric Mental status exam performed with findings of-able to articulate well with normal speech/language, rate, volume and coherence, thought content normal with ability to perform basic computations and apply abstract reasoning and no evidence of hallucinations, delusions, obsessions or homicidal/suicidal ideation.  Musculoskeletal Global Assessment Spine, Ribs and Pelvis - no instability, subluxation or laxity. Right Upper Extremity - no instability, subluxation or laxity.  Lymphatic Head & Neck  General Head & Neck Lymphatics: Bilateral - Description - No Localized lymphadenopathy. Axillary  General Axillary Region: Bilateral - Description - No Localized lymphadenopathy. Femoral & Inguinal  Generalized Femoral & Inguinal Lymphatics: Left - Description - No Localized lymphadenopathy. Right - Description - No Localized lymphadenopathy.    Assessment & Plan   PROLAPSED INTERNAL HEMORRHOIDS, GRADE 4 (K64.3) Impression: Large right anterior chronically prolapsed Grade internal hemorrhoid with the other two grade 3/4.  This will not get better without surgery. I recommended hemorrhoidal ligation a pexy with excision of any remaining prolapsing tissue. At least the larger right anterior pile since it has a significant external component as well. She possibly may need the other piles removed if there is any persistent external tissue.  Did caution her that she will have pain and discomfort especially the first week or so, then it will become less. She has lived with chronic irritation and bleeding for most of her life. She is ready to consider surgery.  EXTERNAL HEMORRHOIDS WITH COMPLICATION (Y19.5) Impression: At least the right anterior pile need to be removed at the time of surgery as well.  The anatomy & physiology of the anorectal region was discussed. The pathophysiology of hemorrhoids and differential diagnosis was  discussed. Natural history risks without surgery was discussed. I stressed the importance of a bowel regimen to have daily soft bowel movements to minimize progression of disease. Interventions such as sclerotherapy & banding were discussed.  The patient's symptoms are not adequately controlled by medicines and other non-operative treatments. I feel the risks & problems of no surgery outweigh the operative risks; therefore, I recommended surgery to treat the hemorrhoids by ligation, pexy, and possible resection.  Risks such as bleeding, infection, urinary difficulties, need for further treatment, heart attack, death, and other risks were discussed. I noted a good likelihood this will help address the problem. Goals of  post-operative recovery were discussed as well. Possibility that this will not correct all symptoms was explained. Post-operative pain, bleeding, constipation, and other problems after surgery were discussed. We will work to minimize complications. Educational handouts further explaining the pathology, treatment options, and bowel regimen were given as well. Questions were answered. The patient expresses understanding & wishes to proceed with surgery.  Pt Education - [facility] Rectal Prep for Anorectal outpatient/office surgery: discussed with patient and provided information. Pt Education - [facility] Rectal Surgery HCI (Bertran Zeimet): discussed with patient and provided information.  DIVERTICULITIS OF LARGE INTESTINE WITHOUT PERFORATION OR ABSCESS WITHOUT BLEEDING (K57.32) Impression: Questionable diagnosis of diverticulitis on a CAT scan in 2018. Some symptoms suspicious in the past couple years.  However, does not seem severely debilitating at this time. I would hold off on doing anything more aggressive unless her gastroenterologist feels like that needs to be reevaluated. I recommended a low-fat, high-fiber diet to minimize future flares.  Adin Hector, MD, FACS,  [facility]astrointestinal and [facility]ally [facility]vasive Surgery    [address], [place], [place] [address] [phone] Main / Paging [phone] Fax

## 2017-12-05 NOTE — Anesthesia Procedure Notes (Signed)
Procedure Name: Intubation Date/Time: 12/05/2017 3:42 PM Performed by: Cynda Familia, CRNA Pre-anesthesia Checklist: Patient identified, Emergency Drugs available, Suction available and Patient being monitored Patient Re-evaluated:Patient Re-evaluated prior to induction Oxygen Delivery Method: Circle System Utilized Preoxygenation: Pre-oxygenation with 100% oxygen Induction Type: IV induction Ventilation: Mask ventilation without difficulty Laryngoscope Size: Miller and 2 Grade View: Grade I Tube type: Oral Tube size: 7.0 mm Number of attempts: 1 Airway Equipment and Method: Stylet Placement Confirmation: ETT inserted through vocal cords under direct vision,  positive ETCO2 and breath sounds checked- equal and bilateral Secured at: 21 cm Tube secured with: Tape Dental Injury: Teeth and Oropharynx as per pre-operative assessment  Comments: Smooth IV induction Turk-- intubation AM CRNA- atraumatic- no upper teeth front lower teeth chipped-- unchanged after laryngoscopy-- bilat BS Gifford Shave

## 2017-12-05 NOTE — Anesthesia Preprocedure Evaluation (Addendum)
Anesthesia Evaluation  Patient identified by MRN, date of birth, ID band Patient awake    Reviewed: Allergy & Precautions, NPO status , Patient's Chart, lab work & pertinent test results, reviewed documented beta blocker date and time   Airway Mallampati: II  TM Distance: <3 FB Neck ROM: Full    Dental  (+) Dental Advisory Given, Upper Dentures, Chipped, Poor Dentition, Missing   Pulmonary neg pulmonary ROS,    Pulmonary exam normal breath sounds clear to auscultation       Cardiovascular hypertension, Pt. on medications and Pt. on home beta blockers Normal cardiovascular exam Rhythm:Regular Rate:Normal     Neuro/Psych  Headaches, negative psych ROS   GI/Hepatic Neg liver ROS, GERD  Medicated and Controlled,  Endo/Other  negative endocrine ROS  Renal/GU negative Renal ROS     Musculoskeletal negative musculoskeletal ROS (+)   Abdominal   Peds  Hematology negative hematology ROS (+)   Anesthesia Other Findings Day of surgery medications reviewed with the patient.  Reproductive/Obstetrics                           Anesthesia Physical Anesthesia Plan  ASA: II  Anesthesia Plan: General   Post-op Pain Management:    Induction: Intravenous  PONV Risk Score and Plan: 3 and Midazolam, Dexamethasone and Ondansetron  Airway Management Planned: Oral ETT  Additional Equipment:   Intra-op Plan:   Post-operative Plan: Extubation in OR  Informed Consent: I have reviewed the patients History and Physical, chart, labs and discussed the procedure including the risks, benefits and alternatives for the proposed anesthesia with the patient or authorized representative who has indicated his/her understanding and acceptance.   Dental advisory given  Plan Discussed with: CRNA  Anesthesia Plan Comments:        Anesthesia Quick Evaluation

## 2017-12-06 NOTE — Anesthesia Postprocedure Evaluation (Signed)
Anesthesia Post Note  Patient: Judy Watts  Procedure(s) Performed: ANORECTAL EXAM UNDER ANESTHESIA WITH HEMORRHOIDECTOMY (N/A ) HEMORRHOIDAL LIGATION/PEXY (N/A )     Patient location during evaluation: PACU Anesthesia Type: General Level of consciousness: awake and alert Pain management: pain level controlled Vital Signs Assessment: post-procedure vital signs reviewed and stable Respiratory status: spontaneous breathing, nonlabored ventilation and respiratory function stable Cardiovascular status: blood pressure returned to baseline and stable Postop Assessment: no apparent nausea or vomiting Anesthetic complications: no    Last Vitals:  Vitals:   12/05/17 1830 12/05/17 1900  BP: 117/68 127/85  Pulse: 80 82  Resp: 16   Temp: (!) 36.3 C (!) 36.4 C  SpO2: 94% 98%    Last Pain:  Vitals:   12/05/17 1900  PainSc: 0-No pain                 Catalina Gravel

## 2017-12-07 ENCOUNTER — Encounter (HOSPITAL_COMMUNITY): Payer: Self-pay | Admitting: Surgery

## 2017-12-15 ENCOUNTER — Ambulatory Visit: Payer: Medicare HMO | Admitting: Orthopedic Surgery

## 2017-12-15 VITALS — BP 115/50 | HR 52 | Ht 66.0 in | Wt 133.0 lb

## 2017-12-15 DIAGNOSIS — M25512 Pain in left shoulder: Secondary | ICD-10-CM

## 2017-12-15 NOTE — Progress Notes (Signed)
Progress Note   Patient ID: Judy Watts, female   DOB: 06/08/1946, 72 y.o.   MRN: [id number]   Chief Complaint  Patient presents with  . Follow-up    Recheck on left shoulder.    HPI 72 year old female treated for left shoulder adhesive capsulitis and pain with injection and exercise she reports return to full range of motion  ROS   No Known Allergies   BP (!) 115/50   Pulse (!) 52   Ht 5\' 6"  (1.676 m)   Wt 133 lb (60.3 kg)   BMI 21.47 kg/m   Physical Exam  Musculoskeletal:       Left shoulder: She exhibits normal range of motion, no tenderness, no bony tenderness, no swelling, no effusion, no deformity and normal strength.     Medical decisions:   Data  Imaging:   no  Encounter Diagnosis  Name Primary?  . Pain in joint of left shoulder Yes    PLAN:   Return to normal activity follow-up as needed    Arther Abbott, MD 12/15/2017 2:24 PM

## 2018-02-02 DIAGNOSIS — G8929 Other chronic pain: Secondary | ICD-10-CM | POA: Diagnosis not present

## 2018-02-02 DIAGNOSIS — E876 Hypokalemia: Secondary | ICD-10-CM | POA: Diagnosis not present

## 2018-02-02 DIAGNOSIS — G9009 Other idiopathic peripheral autonomic neuropathy: Secondary | ICD-10-CM | POA: Diagnosis not present

## 2018-02-02 DIAGNOSIS — Z0000 Encounter for general adult medical examination without abnormal findings: Secondary | ICD-10-CM | POA: Diagnosis not present

## 2018-02-02 DIAGNOSIS — N390 Urinary tract infection, site not specified: Secondary | ICD-10-CM | POA: Diagnosis not present

## 2018-02-02 DIAGNOSIS — K648 Other hemorrhoids: Secondary | ICD-10-CM | POA: Diagnosis not present

## 2018-02-02 DIAGNOSIS — E782 Mixed hyperlipidemia: Secondary | ICD-10-CM | POA: Diagnosis not present

## 2018-02-02 DIAGNOSIS — R110 Nausea: Secondary | ICD-10-CM | POA: Diagnosis not present

## 2018-02-02 DIAGNOSIS — M545 Low back pain: Secondary | ICD-10-CM | POA: Diagnosis not present

## 2018-02-02 DIAGNOSIS — K219 Gastro-esophageal reflux disease without esophagitis: Secondary | ICD-10-CM | POA: Diagnosis not present

## 2018-02-12 DIAGNOSIS — E782 Mixed hyperlipidemia: Secondary | ICD-10-CM | POA: Diagnosis not present

## 2018-02-12 DIAGNOSIS — K219 Gastro-esophageal reflux disease without esophagitis: Secondary | ICD-10-CM | POA: Diagnosis not present

## 2018-02-12 DIAGNOSIS — M545 Low back pain: Secondary | ICD-10-CM | POA: Diagnosis not present

## 2018-02-12 DIAGNOSIS — R300 Dysuria: Secondary | ICD-10-CM | POA: Diagnosis not present

## 2018-02-12 DIAGNOSIS — G8929 Other chronic pain: Secondary | ICD-10-CM | POA: Diagnosis not present

## 2018-02-12 DIAGNOSIS — Z6828 Body mass index (BMI) 28.0-28.9, adult: Secondary | ICD-10-CM | POA: Diagnosis not present

## 2018-02-12 DIAGNOSIS — G9009 Other idiopathic peripheral autonomic neuropathy: Secondary | ICD-10-CM | POA: Diagnosis not present

## 2018-02-12 DIAGNOSIS — B3749 Other urogenital candidiasis: Secondary | ICD-10-CM | POA: Diagnosis not present

## 2018-02-12 DIAGNOSIS — N390 Urinary tract infection, site not specified: Secondary | ICD-10-CM | POA: Diagnosis not present

## 2018-02-12 DIAGNOSIS — K648 Other hemorrhoids: Secondary | ICD-10-CM | POA: Diagnosis not present

## 2018-02-12 DIAGNOSIS — R110 Nausea: Secondary | ICD-10-CM | POA: Diagnosis not present

## 2018-02-12 DIAGNOSIS — Z0000 Encounter for general adult medical examination without abnormal findings: Secondary | ICD-10-CM | POA: Diagnosis not present

## 2018-02-12 DIAGNOSIS — E876 Hypokalemia: Secondary | ICD-10-CM | POA: Diagnosis not present

## 2018-04-01 ENCOUNTER — Ambulatory Visit (HOSPITAL_COMMUNITY): Payer: Medicare HMO

## 2018-04-08 DIAGNOSIS — E782 Mixed hyperlipidemia: Secondary | ICD-10-CM | POA: Diagnosis not present

## 2018-04-08 DIAGNOSIS — E876 Hypokalemia: Secondary | ICD-10-CM | POA: Diagnosis not present

## 2018-04-08 DIAGNOSIS — R7301 Impaired fasting glucose: Secondary | ICD-10-CM | POA: Diagnosis not present

## 2018-04-13 ENCOUNTER — Other Ambulatory Visit (HOSPITAL_COMMUNITY): Payer: Self-pay | Admitting: Internal Medicine

## 2018-04-13 ENCOUNTER — Encounter (HOSPITAL_COMMUNITY): Payer: Self-pay

## 2018-04-13 ENCOUNTER — Encounter (HOSPITAL_COMMUNITY)
Admission: RE | Admit: 2018-04-13 | Discharge: 2018-04-13 | Disposition: A | Discharge status: Home / Self Care | Payer: Medicare HMO | Source: Ambulatory Visit | Attending: Internal Medicine | Admitting: Internal Medicine

## 2018-04-13 DIAGNOSIS — E782 Mixed hyperlipidemia: Secondary | ICD-10-CM | POA: Diagnosis not present

## 2018-04-13 DIAGNOSIS — K219 Gastro-esophageal reflux disease without esophagitis: Secondary | ICD-10-CM | POA: Diagnosis not present

## 2018-04-13 DIAGNOSIS — G43019 Migraine without aura, intractable, without status migrainosus: Secondary | ICD-10-CM | POA: Diagnosis not present

## 2018-04-13 DIAGNOSIS — M545 Low back pain: Secondary | ICD-10-CM | POA: Diagnosis not present

## 2018-04-13 DIAGNOSIS — M810 Age-related osteoporosis without current pathological fracture: Secondary | ICD-10-CM | POA: Diagnosis not present

## 2018-04-13 DIAGNOSIS — Z1231 Encounter for screening mammogram for malignant neoplasm of breast: Secondary | ICD-10-CM

## 2018-04-13 DIAGNOSIS — E876 Hypokalemia: Secondary | ICD-10-CM | POA: Diagnosis not present

## 2018-04-13 DIAGNOSIS — J302 Other seasonal allergic rhinitis: Secondary | ICD-10-CM | POA: Diagnosis not present

## 2018-04-13 DIAGNOSIS — G9009 Other idiopathic peripheral autonomic neuropathy: Secondary | ICD-10-CM | POA: Diagnosis not present

## 2018-04-13 DIAGNOSIS — Z23 Encounter for immunization: Secondary | ICD-10-CM | POA: Diagnosis not present

## 2018-04-13 MED ORDER — DENOSUMAB 60 MG/ML ~~LOC~~ SOSY
60.0000 mg | PREFILLED_SYRINGE | Freq: Once | SUBCUTANEOUS | Status: AC
  Administered 2018-04-13: 60 mg via SUBCUTANEOUS
  Filled 2018-04-13: qty 1

## 2018-05-07 ENCOUNTER — Ambulatory Visit (HOSPITAL_COMMUNITY)
Admission: RE | Admit: 2018-05-07 | Discharge: 2018-05-07 | Disposition: A | Discharge status: Home / Self Care | Payer: Medicare HMO | Source: Ambulatory Visit | Attending: Internal Medicine | Admitting: Internal Medicine

## 2018-05-07 DIAGNOSIS — Z1231 Encounter for screening mammogram for malignant neoplasm of breast: Secondary | ICD-10-CM | POA: Diagnosis not present

## 2018-05-19 DIAGNOSIS — J309 Allergic rhinitis, unspecified: Secondary | ICD-10-CM | POA: Diagnosis not present

## 2018-05-19 DIAGNOSIS — R6882 Decreased libido: Secondary | ICD-10-CM | POA: Diagnosis not present

## 2018-05-21 DIAGNOSIS — R109 Unspecified abdominal pain: Secondary | ICD-10-CM | POA: Diagnosis not present

## 2018-05-21 DIAGNOSIS — R634 Abnormal weight loss: Secondary | ICD-10-CM | POA: Diagnosis not present

## 2018-05-21 DIAGNOSIS — K219 Gastro-esophageal reflux disease without esophagitis: Secondary | ICD-10-CM | POA: Diagnosis not present

## 2018-05-21 DIAGNOSIS — Z8719 Personal history of other diseases of the digestive system: Secondary | ICD-10-CM | POA: Diagnosis not present

## 2018-06-25 DIAGNOSIS — D224 Melanocytic nevi of scalp and neck: Secondary | ICD-10-CM | POA: Diagnosis not present

## 2018-06-25 DIAGNOSIS — L821 Other seborrheic keratosis: Secondary | ICD-10-CM | POA: Diagnosis not present

## 2018-06-25 DIAGNOSIS — Z23 Encounter for immunization: Secondary | ICD-10-CM | POA: Diagnosis not present

## 2018-09-22 DIAGNOSIS — M810 Age-related osteoporosis without current pathological fracture: Secondary | ICD-10-CM | POA: Diagnosis not present

## 2018-09-22 DIAGNOSIS — K219 Gastro-esophageal reflux disease without esophagitis: Secondary | ICD-10-CM | POA: Diagnosis not present

## 2018-09-22 DIAGNOSIS — M545 Low back pain: Secondary | ICD-10-CM | POA: Diagnosis not present

## 2018-09-22 DIAGNOSIS — G8929 Other chronic pain: Secondary | ICD-10-CM | POA: Diagnosis not present

## 2018-09-22 DIAGNOSIS — G43019 Migraine without aura, intractable, without status migrainosus: Secondary | ICD-10-CM | POA: Diagnosis not present

## 2018-09-22 DIAGNOSIS — E876 Hypokalemia: Secondary | ICD-10-CM | POA: Diagnosis not present

## 2018-09-22 DIAGNOSIS — J309 Allergic rhinitis, unspecified: Secondary | ICD-10-CM | POA: Diagnosis not present

## 2018-09-22 DIAGNOSIS — K648 Other hemorrhoids: Secondary | ICD-10-CM | POA: Diagnosis not present

## 2018-09-22 DIAGNOSIS — E782 Mixed hyperlipidemia: Secondary | ICD-10-CM | POA: Diagnosis not present

## 2018-10-07 IMAGING — MG 2D DIGITAL SCREENING BILATERAL MAMMOGRAM WITH CAD AND ADJUNCT TO
6 of 9 series · 6 of 25 positions shown · non-contrast
Comparison: Previous exam(s).

CLINICAL DATA: Screening.

EXAM:
2D DIGITAL SCREENING BILATERAL MAMMOGRAM WITH CAD AND ADJUNCT TOMO

[R MLO (1 of 2)]
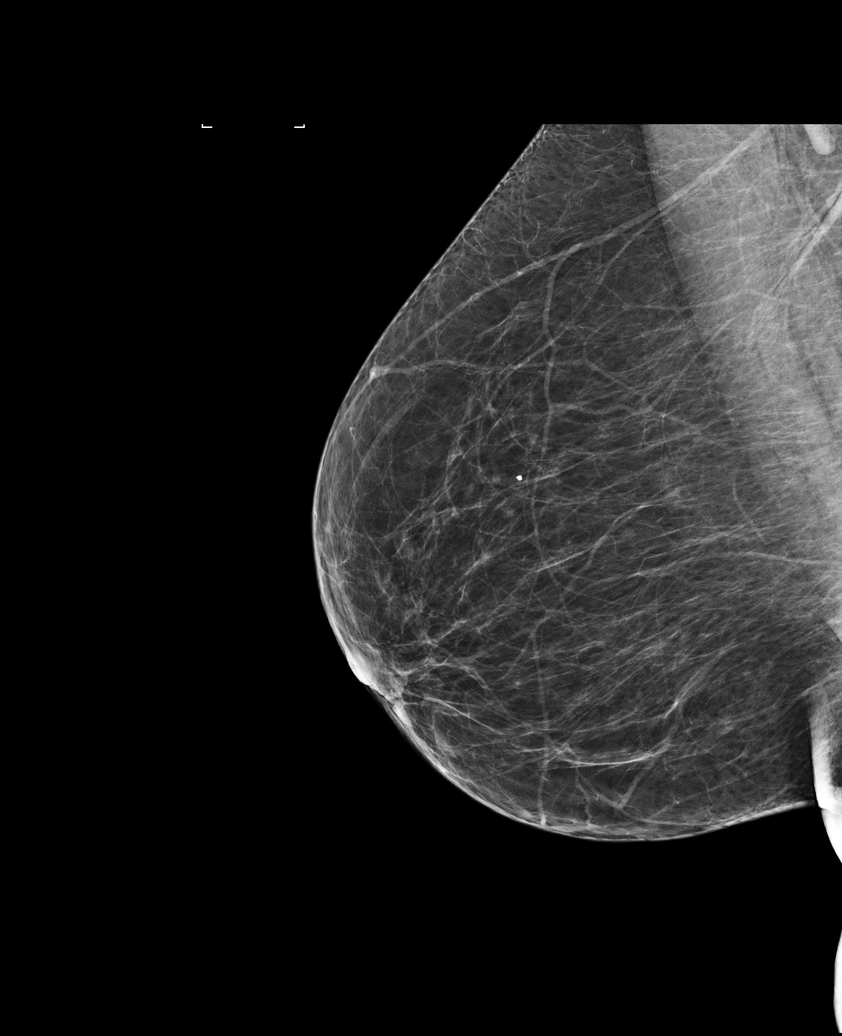

[L CC]
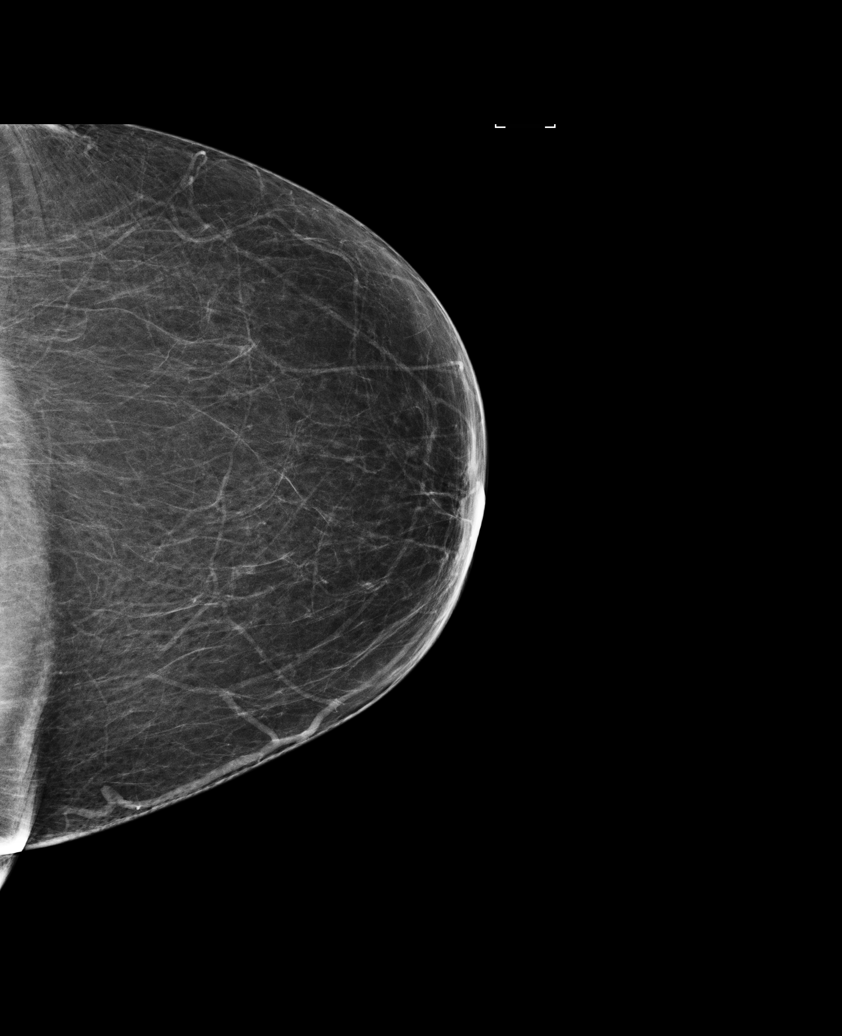

[L MLO]
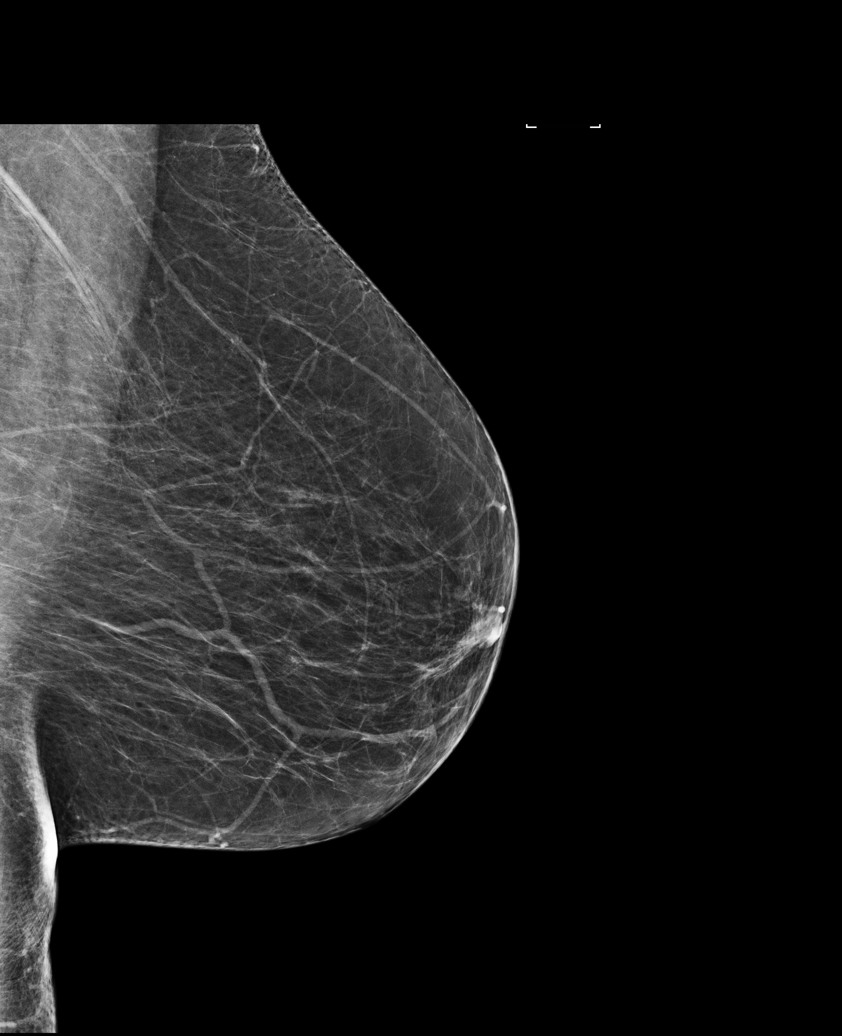

[R CC]
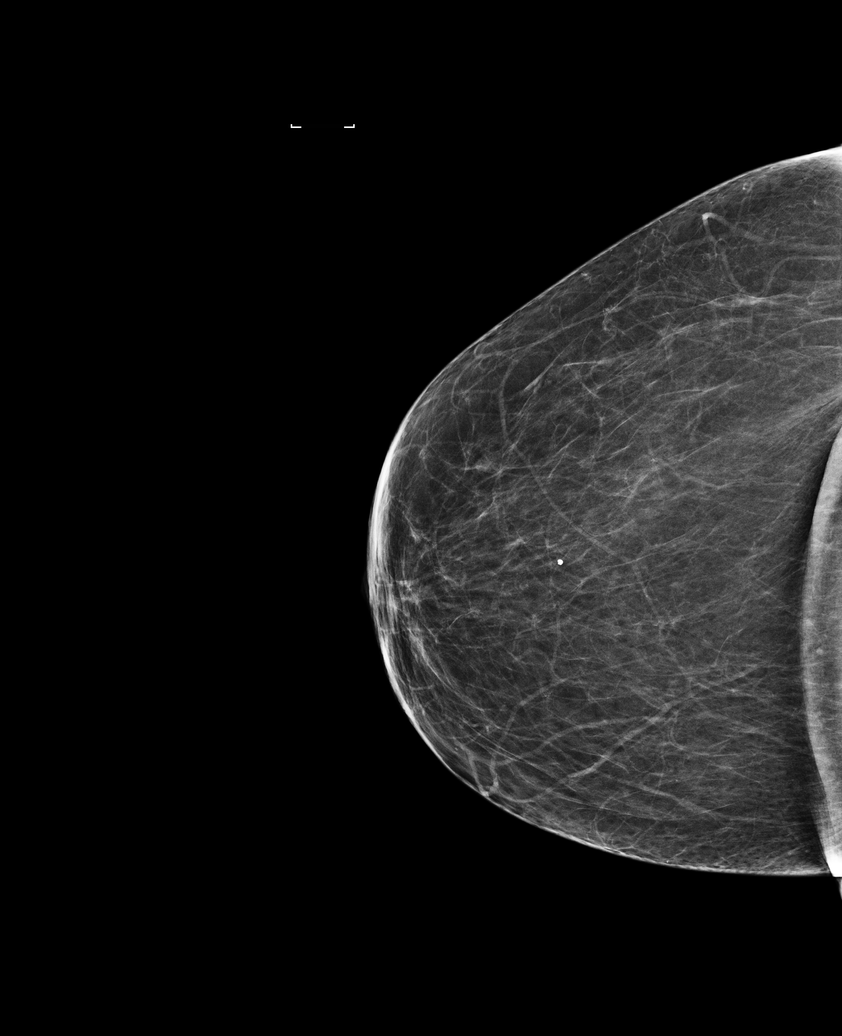

[R MLO (2 of 2)]
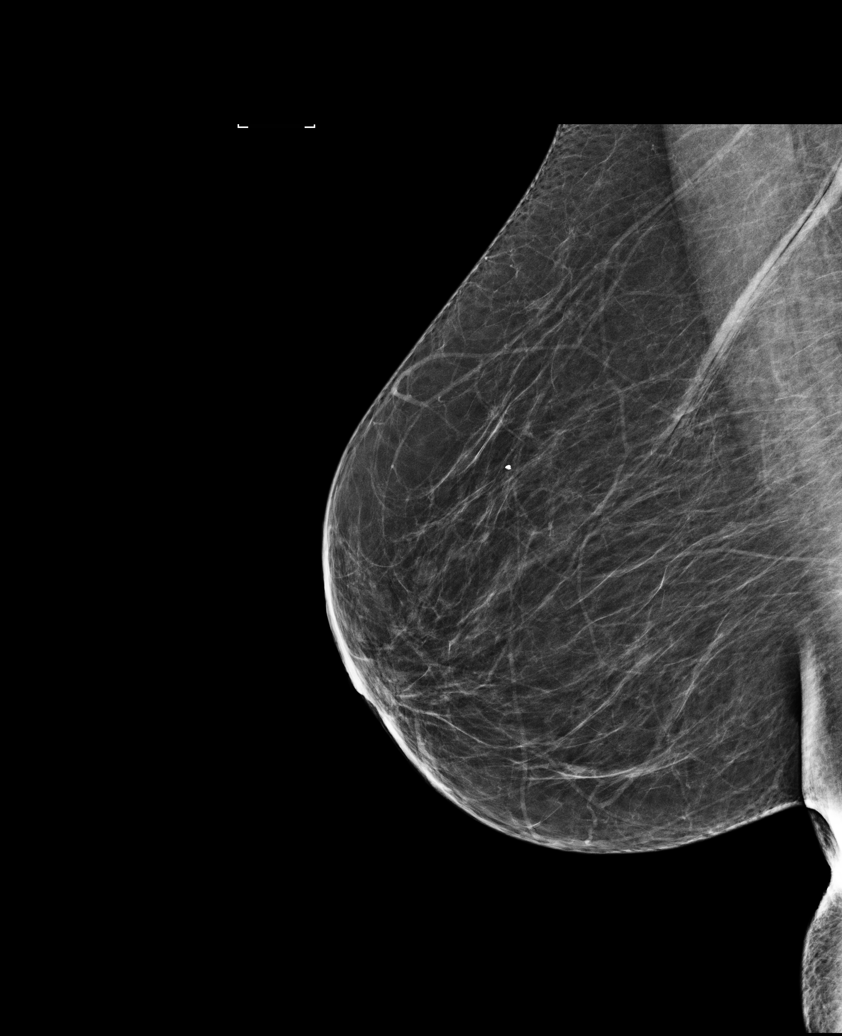

[L CC tomo · tomo slice 27/53.0]
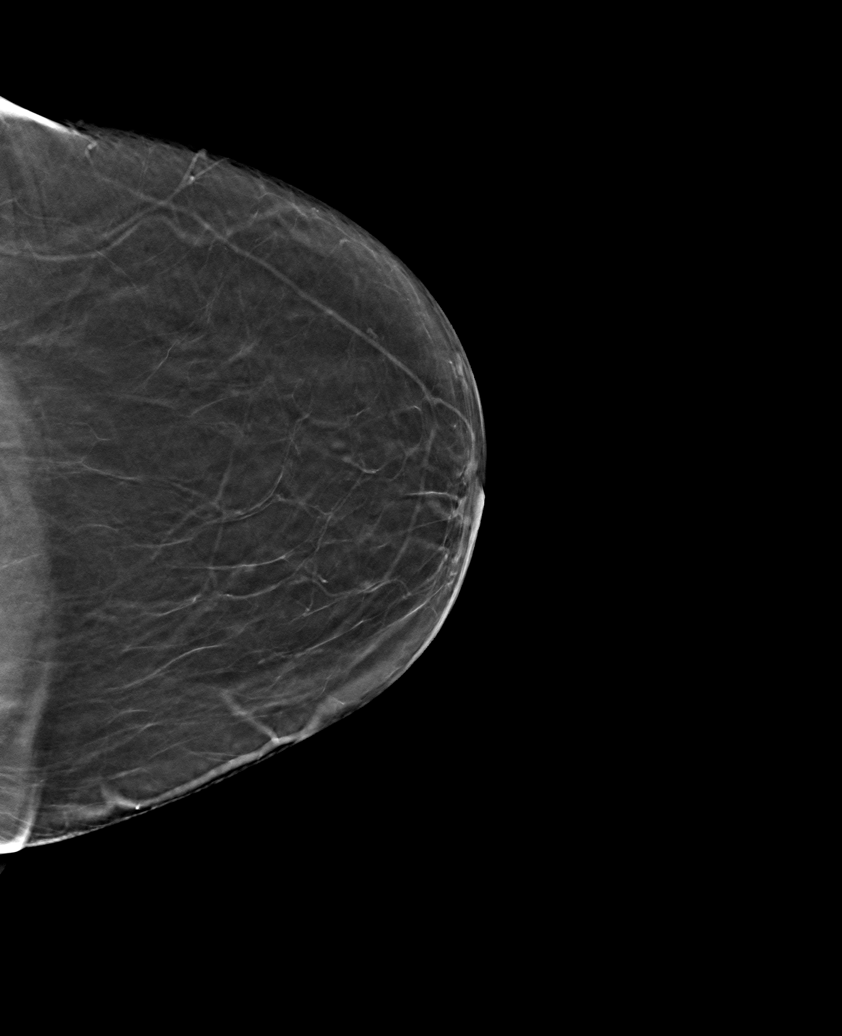

[6 of 25 positions shown; findings below may reference images not displayed]

ACR Breast Density Category b: There are scattered areas of
fibroglandular density.
FINDINGS: There are no findings suspicious for malignancy. Images were
processed with CAD.
IMPRESSION: No mammographic evidence of malignancy. A result letter of this
screening mammogram will be mailed directly to the patient.

RECOMMENDATION:
Screening mammogram in one year. (Code:97-6-RS4)

BI-RADS CATEGORY  1: Negative.

## 2018-10-09 ENCOUNTER — Inpatient Hospital Stay (HOSPITAL_COMMUNITY): Admission: RE | Admit: 2018-10-09 | Payer: Medicare HMO | Source: Ambulatory Visit

## 2018-10-14 DIAGNOSIS — K219 Gastro-esophageal reflux disease without esophagitis: Secondary | ICD-10-CM | POA: Diagnosis not present

## 2018-10-14 DIAGNOSIS — Z0000 Encounter for general adult medical examination without abnormal findings: Secondary | ICD-10-CM | POA: Diagnosis not present

## 2018-10-14 DIAGNOSIS — E785 Hyperlipidemia, unspecified: Secondary | ICD-10-CM | POA: Diagnosis not present

## 2018-10-14 DIAGNOSIS — G43019 Migraine without aura, intractable, without status migrainosus: Secondary | ICD-10-CM | POA: Diagnosis not present

## 2018-10-14 DIAGNOSIS — J302 Other seasonal allergic rhinitis: Secondary | ICD-10-CM | POA: Diagnosis not present

## 2018-10-14 DIAGNOSIS — G629 Polyneuropathy, unspecified: Secondary | ICD-10-CM | POA: Diagnosis not present

## 2018-10-14 DIAGNOSIS — K580 Irritable bowel syndrome with diarrhea: Secondary | ICD-10-CM | POA: Diagnosis not present

## 2018-10-14 DIAGNOSIS — E876 Hypokalemia: Secondary | ICD-10-CM | POA: Diagnosis not present

## 2018-10-14 DIAGNOSIS — M545 Low back pain: Secondary | ICD-10-CM | POA: Diagnosis not present

## 2018-11-19 ENCOUNTER — Telehealth: Payer: Self-pay | Admitting: Orthopedic Surgery

## 2018-11-19 NOTE — Telephone Encounter (Signed)
Patient called with questions about scheduling appointment with Dr Aline Brochure. She has been scheduled for (new)problem of left ankle pain. She also relayed she is having some back pain as well as hip pain. Scheduled appointment for the ankle problem. Medical records from primary care, Dr Nevada Crane, are to follow for Dr Aline Brochure to review and advise regarding back problems. Patient voiced understanding.

## 2018-11-30 ENCOUNTER — Ambulatory Visit: Payer: Medicare HMO | Admitting: Orthopedic Surgery

## 2018-11-30 ENCOUNTER — Encounter: Payer: Self-pay | Admitting: Orthopedic Surgery

## 2018-11-30 ENCOUNTER — Ambulatory Visit (INDEPENDENT_AMBULATORY_CARE_PROVIDER_SITE_OTHER): Payer: Medicare HMO

## 2018-11-30 ENCOUNTER — Other Ambulatory Visit: Payer: Self-pay

## 2018-11-30 VITALS — BP 114/70 | HR 62 | Temp 97.4°F | Ht 66.0 in | Wt 125.0 lb

## 2018-11-30 DIAGNOSIS — G8929 Other chronic pain: Secondary | ICD-10-CM | POA: Diagnosis not present

## 2018-11-30 DIAGNOSIS — M25572 Pain in left ankle and joints of left foot: Secondary | ICD-10-CM

## 2018-11-30 DIAGNOSIS — M25552 Pain in left hip: Secondary | ICD-10-CM | POA: Diagnosis not present

## 2018-11-30 DIAGNOSIS — M25561 Pain in right knee: Secondary | ICD-10-CM | POA: Diagnosis not present
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
## 2018-12-24 DIAGNOSIS — M810 Age-related osteoporosis without current pathological fracture: Secondary | ICD-10-CM | POA: Diagnosis not present

## 2018-12-24 DIAGNOSIS — E782 Mixed hyperlipidemia: Secondary | ICD-10-CM | POA: Diagnosis not present

## 2018-12-24 DIAGNOSIS — Z0000 Encounter for general adult medical examination without abnormal findings: Secondary | ICD-10-CM | POA: Diagnosis not present

## 2018-12-28 ENCOUNTER — Other Ambulatory Visit: Payer: Self-pay | Admitting: Internal Medicine

## 2018-12-28 DIAGNOSIS — K219 Gastro-esophageal reflux disease without esophagitis: Secondary | ICD-10-CM | POA: Diagnosis not present

## 2018-12-28 DIAGNOSIS — J302 Other seasonal allergic rhinitis: Secondary | ICD-10-CM | POA: Diagnosis not present

## 2018-12-28 DIAGNOSIS — M810 Age-related osteoporosis without current pathological fracture: Secondary | ICD-10-CM | POA: Diagnosis not present

## 2018-12-28 DIAGNOSIS — Z780 Asymptomatic menopausal state: Secondary | ICD-10-CM

## 2018-12-28 DIAGNOSIS — E876 Hypokalemia: Secondary | ICD-10-CM | POA: Diagnosis not present

## 2018-12-28 DIAGNOSIS — G629 Polyneuropathy, unspecified: Secondary | ICD-10-CM | POA: Diagnosis not present

## 2018-12-28 DIAGNOSIS — E785 Hyperlipidemia, unspecified: Secondary | ICD-10-CM | POA: Diagnosis not present

## 2018-12-28 DIAGNOSIS — M545 Low back pain: Secondary | ICD-10-CM | POA: Diagnosis not present

## 2018-12-28 DIAGNOSIS — M25552 Pain in left hip: Secondary | ICD-10-CM | POA: Diagnosis not present

## 2018-12-28 DIAGNOSIS — G43019 Migraine without aura, intractable, without status migrainosus: Secondary | ICD-10-CM | POA: Diagnosis not present

## 2019-01-06 ENCOUNTER — Encounter (HOSPITAL_COMMUNITY)
Admission: RE | Admit: 2019-01-06 | Discharge: 2019-01-06 | Disposition: A | Discharge status: Home / Self Care | Payer: Medicare HMO | Source: Ambulatory Visit | Attending: Internal Medicine | Admitting: Internal Medicine

## 2019-01-06 NOTE — Progress Notes (Signed)
01/06/2019 1135- pt called and stated that her husband had been tested for covid-19 yesterday. No results yet. Wants to cancel appt for today. Will call and reschedule when she has his results back.

## 2019-02-05 ENCOUNTER — Encounter (HOSPITAL_COMMUNITY): Payer: Self-pay

## 2019-02-05 ENCOUNTER — Encounter (HOSPITAL_COMMUNITY)
Admission: RE | Admit: 2019-02-05 | Discharge: 2019-02-05 | Disposition: A | Discharge status: Home / Self Care | Payer: Medicare HMO | Source: Ambulatory Visit | Attending: Internal Medicine | Admitting: Internal Medicine

## 2019-02-05 ENCOUNTER — Other Ambulatory Visit: Payer: Self-pay

## 2019-02-05 DIAGNOSIS — M810 Age-related osteoporosis without current pathological fracture: Secondary | ICD-10-CM | POA: Insufficient documentation

## 2019-02-05 MED ORDER — DENOSUMAB 60 MG/ML ~~LOC~~ SOSY
60.0000 mg | PREFILLED_SYRINGE | Freq: Once | SUBCUTANEOUS | Status: AC
  Administered 2019-02-05: 60 mg via SUBCUTANEOUS

## 2019-02-05 MED ORDER — DENOSUMAB 60 MG/ML ~~LOC~~ SOSY
PREFILLED_SYRINGE | SUBCUTANEOUS | Status: AC
  Filled 2019-02-05: qty 1

## 2019-02-05 NOTE — Discharge Instructions (Signed)
Denosumab injection °What is this medicine? °DENOSUMAB (den oh sue mab) slows bone breakdown. Prolia is used to treat osteoporosis in women after menopause and in men, and in people who are taking corticosteroids for 6 months or more. Xgeva is used to treat a high calcium level due to cancer and to prevent bone fractures and other bone problems caused by multiple myeloma or cancer bone metastases. Xgeva is also used to treat giant cell tumor of the bone. °This medicine may be used for other purposes; ask your health care provider or pharmacist if you have questions. °COMMON BRAND NAME(S): Prolia, XGEVA °What should I tell my health care provider before I take this medicine? °They need to know if you have any of these conditions: °· dental disease °· having surgery or tooth extraction °· infection °· kidney disease °· low levels of calcium or Vitamin D in the blood °· malnutrition °· on hemodialysis °· skin conditions or sensitivity °· thyroid or parathyroid disease °· an unusual reaction to denosumab, other medicines, foods, dyes, or preservatives °· pregnant or trying to get pregnant °· breast-feeding °How should I use this medicine? °This medicine is for injection under the skin. It is given by a health care professional in a hospital or clinic setting. °A special MedGuide will be given to you before each treatment. Be sure to read this information carefully each time. °For Prolia, talk to your pediatrician regarding the use of this medicine in children. Special care may be needed. For Xgeva, talk to your pediatrician regarding the use of this medicine in children. While this drug may be prescribed for children as young as 13 years for selected conditions, precautions do apply. °Overdosage: If you think you have taken too much of this medicine contact a poison control center or emergency room at once. °NOTE: This medicine is only for you. Do not share this medicine with others. °What if I miss a dose? °It is  important not to miss your dose. Call your doctor or health care professional if you are unable to keep an appointment. °What may interact with this medicine? °Do not take this medicine with any of the following medications: °· other medicines containing denosumab °This medicine may also interact with the following medications: °· medicines that lower your chance of fighting infection °· steroid medicines like prednisone or cortisone °This list may not describe all possible interactions. Give your health care provider a list of all the medicines, herbs, non-prescription drugs, or dietary supplements you use. Also tell them if you smoke, drink alcohol, or use illegal drugs. Some items may interact with your medicine. °What should I watch for while using this medicine? °Visit your doctor or health care professional for regular checks on your progress. Your doctor or health care professional may order blood tests and other tests to see how you are doing. °Call your doctor or health care professional for advice if you get a fever, chills or sore throat, or other symptoms of a cold or flu. Do not treat yourself. This drug may decrease your body's ability to fight infection. Try to avoid being around people who are sick. °You should make sure you get enough calcium and vitamin D while you are taking this medicine, unless your doctor tells you not to. Discuss the foods you eat and the vitamins you take with your health care professional. °See your dentist regularly. Brush and floss your teeth as directed. Before you have any dental work done, tell your dentist you are   receiving this medicine. Do not become pregnant while taking this medicine or for 5 months after stopping it. Talk with your doctor or health care professional about your birth control options while taking this medicine. Women should inform their doctor if they wish to become pregnant or think they might be pregnant. There is a potential for serious side  effects to an unborn child. Talk to your health care professional or pharmacist for more information. What side effects may I notice from receiving this medicine? Side effects that you should report to your doctor or health care professional as soon as possible:  allergic reactions like skin rash, itching or hives, swelling of the face, lips, or tongue  bone pain  breathing problems  dizziness  jaw pain, especially after dental work  redness, blistering, peeling of the skin  signs and symptoms of infection like fever or chills; cough; sore throat; pain or trouble passing urine  signs of low calcium like fast heartbeat, muscle cramps or muscle pain; pain, tingling, numbness in the hands or feet; seizures  unusual bleeding or bruising  unusually weak or tired Side effects that usually do not require medical attention (report to your doctor or health care professional if they continue or are bothersome):  constipation  diarrhea  headache  joint pain  loss of appetite  muscle pain  runny nose  tiredness  upset stomach This list may not describe all possible side effects. Call your doctor for medical advice about side effects. You may report side effects to FDA at 1-800-FDA-1088. Where should I keep my medicine? This medicine is only given in a clinic, doctor's office, or other health care setting and will not be stored at home. NOTE: This sheet is a summary. It may not cover all possible information. If you have questions about this medicine, talk to your doctor, pharmacist, or health care provider.  2020 Elsevier/Gold Standard (2017-10-10 16:10:44)

## 2019-02-24 ENCOUNTER — Ambulatory Visit (INDEPENDENT_AMBULATORY_CARE_PROVIDER_SITE_OTHER): Payer: Medicare HMO | Admitting: Orthopedic Surgery

## 2019-02-24 ENCOUNTER — Other Ambulatory Visit: Payer: Self-pay

## 2019-02-24 VITALS — BP 138/89 | HR 66 | Temp 97.0°F | Ht 66.0 in | Wt 121.0 lb

## 2019-02-24 DIAGNOSIS — M25552 Pain in left hip: Secondary | ICD-10-CM

## 2019-02-24 DIAGNOSIS — G8929 Other chronic pain: Secondary | ICD-10-CM

## 2019-02-24 DIAGNOSIS — M25572 Pain in left ankle and joints of left foot: Secondary | ICD-10-CM | POA: Diagnosis not present

## 2019-02-24 DIAGNOSIS — M25561 Pain in right knee: Secondary | ICD-10-CM | POA: Diagnosis not present

## 2019-02-24 NOTE — Progress Notes (Signed)
25 recently seen for left ankle pain diagnosed with arthritis her left leg pain was diagnosed as possible referred pain from prior lower back injury  She has intermittent pain in her left leg and hip radiates down to her left leg no evidence of constant symptoms.  No functional loss and that she symptomatic  Today we see her with mild tenderness in lower back and left buttock Normal gait Without weakness in the lower extremities   Encounter Diagnoses  Name Primary?  . Chronic pain of left ankle Yes  . Chronic pain of right knee   . Chronic left hip pain     Follow-up as needed

## 2019-03-30 ENCOUNTER — Other Ambulatory Visit (HOSPITAL_COMMUNITY): Payer: Self-pay | Admitting: Internal Medicine

## 2019-03-30 DIAGNOSIS — H9202 Otalgia, left ear: Secondary | ICD-10-CM | POA: Diagnosis not present

## 2019-03-30 DIAGNOSIS — Z1231 Encounter for screening mammogram for malignant neoplasm of breast: Secondary | ICD-10-CM

## 2019-04-01 DIAGNOSIS — Z23 Encounter for immunization: Secondary | ICD-10-CM | POA: Diagnosis not present

## 2019-04-29 DIAGNOSIS — Z0100 Encounter for examination of eyes and vision without abnormal findings: Secondary | ICD-10-CM | POA: Diagnosis not present

## 2019-05-10 ENCOUNTER — Ambulatory Visit (HOSPITAL_COMMUNITY): Payer: Medicare HMO

## 2019-07-02 DIAGNOSIS — E785 Hyperlipidemia, unspecified: Secondary | ICD-10-CM | POA: Diagnosis not present

## 2019-07-02 DIAGNOSIS — E782 Mixed hyperlipidemia: Secondary | ICD-10-CM | POA: Diagnosis not present

## 2019-07-05 ENCOUNTER — Other Ambulatory Visit (HOSPITAL_COMMUNITY): Payer: Self-pay | Admitting: Internal Medicine

## 2019-07-05 DIAGNOSIS — G629 Polyneuropathy, unspecified: Secondary | ICD-10-CM | POA: Diagnosis not present

## 2019-07-05 DIAGNOSIS — R944 Abnormal results of kidney function studies: Secondary | ICD-10-CM | POA: Diagnosis not present

## 2019-07-05 DIAGNOSIS — Z780 Asymptomatic menopausal state: Secondary | ICD-10-CM

## 2019-07-05 DIAGNOSIS — E782 Mixed hyperlipidemia: Secondary | ICD-10-CM | POA: Diagnosis not present

## 2019-07-05 DIAGNOSIS — Z13820 Encounter for screening for osteoporosis: Secondary | ICD-10-CM

## 2019-07-05 DIAGNOSIS — E876 Hypokalemia: Secondary | ICD-10-CM | POA: Diagnosis not present

## 2019-07-05 DIAGNOSIS — M810 Age-related osteoporosis without current pathological fracture: Secondary | ICD-10-CM | POA: Diagnosis not present

## 2019-07-05 DIAGNOSIS — G43019 Migraine without aura, intractable, without status migrainosus: Secondary | ICD-10-CM | POA: Diagnosis not present

## 2019-07-05 DIAGNOSIS — M545 Low back pain: Secondary | ICD-10-CM | POA: Diagnosis not present

## 2019-07-05 DIAGNOSIS — J302 Other seasonal allergic rhinitis: Secondary | ICD-10-CM | POA: Diagnosis not present

## 2019-07-05 DIAGNOSIS — M25552 Pain in left hip: Secondary | ICD-10-CM | POA: Diagnosis not present

## 2019-08-06 ENCOUNTER — Other Ambulatory Visit (HOSPITAL_COMMUNITY): Payer: Medicare HMO

## 2019-08-06 ENCOUNTER — Ambulatory Visit (HOSPITAL_COMMUNITY): Payer: Medicare HMO

## 2019-08-09 ENCOUNTER — Encounter (HOSPITAL_COMMUNITY): Admission: RE | Admit: 2019-08-09 | Payer: Medicare HMO | Source: Ambulatory Visit

## 2019-08-13 ENCOUNTER — Other Ambulatory Visit: Payer: Self-pay

## 2019-08-13 ENCOUNTER — Ambulatory Visit (HOSPITAL_COMMUNITY)
Admission: RE | Admit: 2019-08-13 | Discharge: 2019-08-13 | Disposition: A | Discharge status: Home / Self Care | Payer: Medicare HMO | Source: Ambulatory Visit | Attending: Internal Medicine | Admitting: Internal Medicine

## 2019-08-13 DIAGNOSIS — M810 Age-related osteoporosis without current pathological fracture: Secondary | ICD-10-CM | POA: Diagnosis not present

## 2019-08-13 DIAGNOSIS — Z13820 Encounter for screening for osteoporosis: Secondary | ICD-10-CM | POA: Diagnosis not present

## 2019-08-13 DIAGNOSIS — Z1231 Encounter for screening mammogram for malignant neoplasm of breast: Secondary | ICD-10-CM | POA: Insufficient documentation

## 2019-08-17 ENCOUNTER — Encounter (HOSPITAL_COMMUNITY)
Admission: RE | Admit: 2019-08-17 | Discharge: 2019-08-17 | Disposition: A | Discharge status: Home / Self Care | Payer: Medicare HMO | Source: Ambulatory Visit | Attending: Internal Medicine | Admitting: Internal Medicine

## 2019-08-17 ENCOUNTER — Other Ambulatory Visit: Payer: Self-pay

## 2019-08-17 DIAGNOSIS — M810 Age-related osteoporosis without current pathological fracture: Secondary | ICD-10-CM | POA: Diagnosis not present

## 2019-08-17 MED ORDER — DENOSUMAB 60 MG/ML ~~LOC~~ SOSY
60.0000 mg | PREFILLED_SYRINGE | Freq: Once | SUBCUTANEOUS | Status: AC
  Administered 2019-08-17: 60 mg via SUBCUTANEOUS

## 2019-10-13 ENCOUNTER — Encounter: Payer: Self-pay | Admitting: Orthopedic Surgery

## 2019-10-13 ENCOUNTER — Other Ambulatory Visit: Payer: Self-pay

## 2019-10-13 ENCOUNTER — Ambulatory Visit: Payer: Medicare HMO | Admitting: Orthopedic Surgery

## 2019-10-13 ENCOUNTER — Ambulatory Visit: Payer: Medicare HMO

## 2019-10-13 VITALS — BP 115/73 | HR 54 | Ht 66.0 in | Wt 130.0 lb

## 2019-10-13 DIAGNOSIS — M25511 Pain in right shoulder: Secondary | ICD-10-CM | POA: Diagnosis not present

## 2019-10-13 DIAGNOSIS — G8929 Other chronic pain: Secondary | ICD-10-CM

## 2019-10-13 DIAGNOSIS — M25372 Other instability, left ankle: Secondary | ICD-10-CM | POA: Diagnosis not present
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
## 2019-10-13 NOTE — Patient Instructions (Signed)
Exercises for shoulder   Ankle sleeve    Procedure note the subacromial injection shoulder RIGHT    Verbal consent was obtained to inject the  RIGHT   Shoulder  Timeout was completed to confirm the injection site is a subacromial space of the  RIGHT  shoulder   Medication used Depo-Medrol 40 mg and lidocaine 1% 3 cc  Anesthesia was provided by ethyl chloride  The injection was performed in the RIGHT  posterior subacromial space. After pinning the skin with alcohol and anesthetized the skin with ethyl chloride the subacromial space was injected using a 20-gauge needle. There were no complications  Sterile dressing was applied.

## 2019-10-18 ENCOUNTER — Ambulatory Visit: Payer: Medicare HMO | Admitting: Orthopedic Surgery

## 2019-11-16 DIAGNOSIS — M25552 Pain in left hip: Secondary | ICD-10-CM | POA: Diagnosis not present

## 2019-11-16 DIAGNOSIS — G4700 Insomnia, unspecified: Secondary | ICD-10-CM | POA: Diagnosis not present

## 2019-11-16 DIAGNOSIS — M545 Low back pain: Secondary | ICD-10-CM | POA: Diagnosis not present

## 2019-11-16 DIAGNOSIS — M5432 Sciatica, left side: Secondary | ICD-10-CM | POA: Diagnosis not present

## 2019-11-16 DIAGNOSIS — H6122 Impacted cerumen, left ear: Secondary | ICD-10-CM | POA: Diagnosis not present

## 2019-12-15 DIAGNOSIS — M5432 Sciatica, left side: Secondary | ICD-10-CM | POA: Diagnosis not present

## 2019-12-15 DIAGNOSIS — M25511 Pain in right shoulder: Secondary | ICD-10-CM | POA: Diagnosis not present

## 2019-12-15 DIAGNOSIS — M25572 Pain in left ankle and joints of left foot: Secondary | ICD-10-CM | POA: Diagnosis not present

## 2019-12-15 DIAGNOSIS — M545 Low back pain: Secondary | ICD-10-CM | POA: Diagnosis not present

## 2019-12-22 ENCOUNTER — Other Ambulatory Visit: Payer: Self-pay

## 2019-12-22 ENCOUNTER — Ambulatory Visit: Payer: Medicare HMO | Admitting: Orthopedic Surgery

## 2019-12-22 VITALS — BP 118/72 | HR 64 | Ht 66.0 in | Wt 130.0 lb

## 2019-12-22 DIAGNOSIS — M75111 Incomplete rotator cuff tear or rupture of right shoulder, not specified as traumatic: Secondary | ICD-10-CM | POA: Diagnosis not present

## 2019-12-22 DIAGNOSIS — M25511 Pain in right shoulder: Secondary | ICD-10-CM | POA: Diagnosis not present

## 2019-12-22 DIAGNOSIS — G8929 Other chronic pain: Secondary | ICD-10-CM | POA: Diagnosis not present
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
## 2019-12-30 DIAGNOSIS — G43019 Migraine without aura, intractable, without status migrainosus: Secondary | ICD-10-CM | POA: Diagnosis not present

## 2019-12-30 DIAGNOSIS — E559 Vitamin D deficiency, unspecified: Secondary | ICD-10-CM | POA: Diagnosis not present

## 2019-12-30 DIAGNOSIS — G8929 Other chronic pain: Secondary | ICD-10-CM | POA: Diagnosis not present

## 2019-12-30 DIAGNOSIS — Z0000 Encounter for general adult medical examination without abnormal findings: Secondary | ICD-10-CM | POA: Diagnosis not present

## 2019-12-30 DIAGNOSIS — Z6821 Body mass index (BMI) 21.0-21.9, adult: Secondary | ICD-10-CM | POA: Diagnosis not present

## 2019-12-30 DIAGNOSIS — E782 Mixed hyperlipidemia: Secondary | ICD-10-CM | POA: Diagnosis not present

## 2019-12-30 DIAGNOSIS — K219 Gastro-esophageal reflux disease without esophagitis: Secondary | ICD-10-CM | POA: Diagnosis not present

## 2019-12-30 DIAGNOSIS — G9009 Other idiopathic peripheral autonomic neuropathy: Secondary | ICD-10-CM | POA: Diagnosis not present

## 2019-12-30 DIAGNOSIS — E876 Hypokalemia: Secondary | ICD-10-CM | POA: Diagnosis not present

## 2019-12-30 DIAGNOSIS — M545 Low back pain: Secondary | ICD-10-CM | POA: Diagnosis not present

## 2019-12-30 DIAGNOSIS — M810 Age-related osteoporosis without current pathological fracture: Secondary | ICD-10-CM | POA: Diagnosis not present

## 2020-01-04 DIAGNOSIS — M25511 Pain in right shoulder: Secondary | ICD-10-CM | POA: Diagnosis not present

## 2020-01-04 DIAGNOSIS — E876 Hypokalemia: Secondary | ICD-10-CM | POA: Diagnosis not present

## 2020-01-04 DIAGNOSIS — M810 Age-related osteoporosis without current pathological fracture: Secondary | ICD-10-CM | POA: Diagnosis not present

## 2020-01-04 DIAGNOSIS — J302 Other seasonal allergic rhinitis: Secondary | ICD-10-CM | POA: Diagnosis not present

## 2020-01-04 DIAGNOSIS — N1831 Chronic kidney disease, stage 3a: Secondary | ICD-10-CM | POA: Diagnosis not present

## 2020-01-04 DIAGNOSIS — E782 Mixed hyperlipidemia: Secondary | ICD-10-CM | POA: Diagnosis not present

## 2020-01-04 DIAGNOSIS — Z0001 Encounter for general adult medical examination with abnormal findings: Secondary | ICD-10-CM | POA: Diagnosis not present

## 2020-01-04 DIAGNOSIS — G43019 Migraine without aura, intractable, without status migrainosus: Secondary | ICD-10-CM | POA: Diagnosis not present

## 2020-01-04 DIAGNOSIS — K219 Gastro-esophageal reflux disease without esophagitis: Secondary | ICD-10-CM | POA: Diagnosis not present

## 2020-01-17 ENCOUNTER — Encounter: Payer: Self-pay | Admitting: Orthopedic Surgery

## 2020-02-11 ENCOUNTER — Other Ambulatory Visit: Payer: Self-pay

## 2020-02-11 ENCOUNTER — Ambulatory Visit (HOSPITAL_COMMUNITY)
Admission: RE | Admit: 2020-02-11 | Discharge: 2020-02-11 | Disposition: A | Discharge status: Home / Self Care | Payer: Medicare HMO | Source: Ambulatory Visit | Attending: Orthopedic Surgery | Admitting: Orthopedic Surgery

## 2020-02-11 ENCOUNTER — Ambulatory Visit (HOSPITAL_COMMUNITY)
Admission: RE | Admit: 2020-02-11 | Discharge: 2020-02-11 | Disposition: A | Discharge status: Home / Self Care | Payer: Medicare HMO | Source: Ambulatory Visit | Attending: Adult Health Nurse Practitioner | Admitting: Adult Health Nurse Practitioner

## 2020-02-11 ENCOUNTER — Other Ambulatory Visit (HOSPITAL_COMMUNITY): Payer: Self-pay | Admitting: Adult Health Nurse Practitioner

## 2020-02-11 DIAGNOSIS — M545 Low back pain, unspecified: Secondary | ICD-10-CM

## 2020-02-11 DIAGNOSIS — M25511 Pain in right shoulder: Secondary | ICD-10-CM | POA: Diagnosis not present

## 2020-02-11 DIAGNOSIS — G8929 Other chronic pain: Secondary | ICD-10-CM | POA: Insufficient documentation

## 2020-02-17 ENCOUNTER — Encounter (HOSPITAL_COMMUNITY): Admission: RE | Admit: 2020-02-17 | Payer: Medicare HMO | Source: Ambulatory Visit

## 2020-03-02 ENCOUNTER — Other Ambulatory Visit: Payer: Self-pay

## 2020-03-02 ENCOUNTER — Encounter: Payer: Self-pay | Admitting: Orthopedic Surgery

## 2020-03-02 ENCOUNTER — Ambulatory Visit: Payer: Medicare HMO | Admitting: Orthopedic Surgery

## 2020-03-02 VITALS — BP 143/85 | HR 58 | Ht 66.0 in | Wt 128.0 lb

## 2020-03-02 DIAGNOSIS — M25511 Pain in right shoulder: Secondary | ICD-10-CM | POA: Diagnosis not present

## 2020-03-02 DIAGNOSIS — G8929 Other chronic pain: Secondary | ICD-10-CM

## 2020-03-02 NOTE — Patient Instructions (Signed)
Shoulder exercises 

## 2020-03-02 NOTE — Progress Notes (Signed)
Chief Complaint  Patient presents with  . Shoulder Pain    right / pain at night increased pain since PCP d/c NSAID  . Results    discuss MRI right shoulder    Painful right shoulder chronic  She is still having pain she was taken off her Voltaren because of her kidney she is very sore  MRI was reviewed and I do not see a cuff tear I see intrinsic tendon changes consistent with tendinosis  Advised her to do exercises no surgery needed follow-up in 8 weeks use Tylenol and topical rubs for pain use ice or heat depending on which feels better

## 2020-03-06 DIAGNOSIS — M25511 Pain in right shoulder: Secondary | ICD-10-CM | POA: Diagnosis not present

## 2020-03-06 DIAGNOSIS — K219 Gastro-esophageal reflux disease without esophagitis: Secondary | ICD-10-CM | POA: Diagnosis not present

## 2020-03-06 DIAGNOSIS — M25572 Pain in left ankle and joints of left foot: Secondary | ICD-10-CM | POA: Diagnosis not present

## 2020-03-06 DIAGNOSIS — E876 Hypokalemia: Secondary | ICD-10-CM | POA: Diagnosis not present

## 2020-03-06 DIAGNOSIS — M545 Low back pain: Secondary | ICD-10-CM | POA: Diagnosis not present

## 2020-03-06 DIAGNOSIS — Z0000 Encounter for general adult medical examination without abnormal findings: Secondary | ICD-10-CM | POA: Diagnosis not present

## 2020-03-06 DIAGNOSIS — N1831 Chronic kidney disease, stage 3a: Secondary | ICD-10-CM | POA: Diagnosis not present

## 2020-03-06 DIAGNOSIS — Z6821 Body mass index (BMI) 21.0-21.9, adult: Secondary | ICD-10-CM | POA: Diagnosis not present

## 2020-03-06 DIAGNOSIS — Z0001 Encounter for general adult medical examination with abnormal findings: Secondary | ICD-10-CM | POA: Diagnosis not present

## 2020-03-06 DIAGNOSIS — G43019 Migraine without aura, intractable, without status migrainosus: Secondary | ICD-10-CM | POA: Diagnosis not present

## 2020-03-06 DIAGNOSIS — G8929 Other chronic pain: Secondary | ICD-10-CM | POA: Diagnosis not present

## 2020-03-06 DIAGNOSIS — G9009 Other idiopathic peripheral autonomic neuropathy: Secondary | ICD-10-CM | POA: Diagnosis not present

## 2020-03-06 DIAGNOSIS — E782 Mixed hyperlipidemia: Secondary | ICD-10-CM | POA: Diagnosis not present

## 2020-03-16 ENCOUNTER — Ambulatory Visit: Payer: Medicare HMO | Admitting: Orthopedic Surgery

## 2020-03-20 ENCOUNTER — Encounter (HOSPITAL_COMMUNITY)
Admission: RE | Admit: 2020-03-20 | Discharge: 2020-03-20 | Disposition: A | Discharge status: Home / Self Care | Payer: Medicare HMO | Source: Ambulatory Visit | Attending: Internal Medicine | Admitting: Internal Medicine

## 2020-03-22 ENCOUNTER — Ambulatory Visit: Payer: Medicare HMO | Admitting: Podiatry

## 2020-04-05 ENCOUNTER — Ambulatory Visit: Payer: Medicare HMO | Admitting: Podiatry

## 2020-04-27 ENCOUNTER — Ambulatory Visit: Payer: Medicare HMO | Admitting: Orthopedic Surgery

## 2020-05-08 ENCOUNTER — Other Ambulatory Visit: Payer: Self-pay

## 2020-05-08 ENCOUNTER — Encounter: Payer: Self-pay | Admitting: Podiatry

## 2020-05-08 ENCOUNTER — Ambulatory Visit: Payer: Medicare HMO | Admitting: Podiatry

## 2020-05-08 ENCOUNTER — Ambulatory Visit (INDEPENDENT_AMBULATORY_CARE_PROVIDER_SITE_OTHER): Payer: Medicare HMO

## 2020-05-08 DIAGNOSIS — M779 Enthesopathy, unspecified: Secondary | ICD-10-CM

## 2020-05-08 DIAGNOSIS — R52 Pain, unspecified: Secondary | ICD-10-CM | POA: Diagnosis not present

## 2020-05-08 DIAGNOSIS — M778 Other enthesopathies, not elsewhere classified: Secondary | ICD-10-CM

## 2020-05-08 MED ORDER — TRIAMCINOLONE ACETONIDE 10 MG/ML IJ SUSP
10.0000 mg | Freq: Once | INTRAMUSCULAR | Status: AC
  Administered 2020-05-08: 10 mg

## 2020-05-15 NOTE — Progress Notes (Signed)
Subjective:   Patient ID: Judy Watts, female   DOB: 74 y.o.   MRN: [id number]   HPI patient presents stating that she has a lot of pain in her left ankle that is been present for several months.  States she does not remember specific injury and states it makes it hard to ambulate.  Patient does not smoke likes to be active   Review of Systems  All other systems reviewed and are negative.       Objective:  Physical Exam Vitals and nursing note reviewed.  Constitutional:      Appearance: She is well-developed.  Pulmonary:     Effort: Pulmonary effort is normal.  Musculoskeletal:        General: Normal range of motion.  Skin:    General: Skin is warm.  Neurological:     Mental Status: She is alert.     Neurovascular status intact muscle strength found to be adequate range of motion within normal limits except for splinting in the left ankle.  There is inflammation of the sinus tarsi with fluid buildup noted.  Patient is found to have good digital perfusion well oriented x3 moderate depression of the arch     Assessment:  Inflammatory capsulitis sinus tarsi left inflammation fluid noted     Plan:  H&P condition reviewed and I did sterile prep and injected the sinus tarsi left 3 mg Kenalog 5 mg Xylocaine applied fascial brace to lift up the arch gave instructions for support and good functioning shoes.  Reappoint to recheck  X-rays indicate that there is moderate depression of the arch and arthritis no other indication of pathology

## 2020-06-13 ENCOUNTER — Other Ambulatory Visit: Payer: Self-pay

## 2020-06-13 ENCOUNTER — Encounter (HOSPITAL_COMMUNITY)
Admission: RE | Admit: 2020-06-13 | Discharge: 2020-06-13 | Disposition: A | Discharge status: Home / Self Care | Payer: Medicare HMO | Source: Ambulatory Visit | Attending: Internal Medicine | Admitting: Internal Medicine

## 2020-06-13 ENCOUNTER — Encounter (HOSPITAL_COMMUNITY): Payer: Self-pay

## 2020-06-13 DIAGNOSIS — K9281 Gastrointestinal mucositis (ulcerative): Secondary | ICD-10-CM | POA: Insufficient documentation

## 2020-06-13 DIAGNOSIS — M810 Age-related osteoporosis without current pathological fracture: Secondary | ICD-10-CM | POA: Insufficient documentation

## 2020-06-13 MED ORDER — DENOSUMAB 60 MG/ML ~~LOC~~ SOSY
60.0000 mg | PREFILLED_SYRINGE | Freq: Once | SUBCUTANEOUS | Status: AC
  Administered 2020-06-13: 60 mg via SUBCUTANEOUS

## 2020-06-15 DIAGNOSIS — N390 Urinary tract infection, site not specified: Secondary | ICD-10-CM | POA: Diagnosis not present

## 2020-06-15 DIAGNOSIS — Z6821 Body mass index (BMI) 21.0-21.9, adult: Secondary | ICD-10-CM | POA: Diagnosis not present

## 2020-06-15 DIAGNOSIS — K219 Gastro-esophageal reflux disease without esophagitis: Secondary | ICD-10-CM | POA: Diagnosis not present

## 2020-06-15 DIAGNOSIS — E782 Mixed hyperlipidemia: Secondary | ICD-10-CM | POA: Diagnosis not present

## 2020-06-15 DIAGNOSIS — Z0000 Encounter for general adult medical examination without abnormal findings: Secondary | ICD-10-CM | POA: Diagnosis not present

## 2020-06-15 DIAGNOSIS — Z0001 Encounter for general adult medical examination with abnormal findings: Secondary | ICD-10-CM | POA: Diagnosis not present

## 2020-06-15 DIAGNOSIS — G9009 Other idiopathic peripheral autonomic neuropathy: Secondary | ICD-10-CM | POA: Diagnosis not present

## 2020-06-15 DIAGNOSIS — G43019 Migraine without aura, intractable, without status migrainosus: Secondary | ICD-10-CM | POA: Diagnosis not present

## 2020-06-15 DIAGNOSIS — R109 Unspecified abdominal pain: Secondary | ICD-10-CM | POA: Diagnosis not present

## 2020-06-15 DIAGNOSIS — G8929 Other chronic pain: Secondary | ICD-10-CM | POA: Diagnosis not present

## 2020-06-15 DIAGNOSIS — E876 Hypokalemia: Secondary | ICD-10-CM | POA: Diagnosis not present

## 2020-06-29 ENCOUNTER — Other Ambulatory Visit: Payer: Self-pay | Admitting: Podiatry

## 2020-06-29 DIAGNOSIS — M778 Other enthesopathies, not elsewhere classified: Secondary | ICD-10-CM

## 2020-07-13 DIAGNOSIS — G43019 Migraine without aura, intractable, without status migrainosus: Secondary | ICD-10-CM | POA: Diagnosis not present

## 2020-07-13 DIAGNOSIS — G9009 Other idiopathic peripheral autonomic neuropathy: Secondary | ICD-10-CM | POA: Diagnosis not present

## 2020-07-13 DIAGNOSIS — Z0001 Encounter for general adult medical examination with abnormal findings: Secondary | ICD-10-CM | POA: Diagnosis not present

## 2020-07-13 DIAGNOSIS — Z6821 Body mass index (BMI) 21.0-21.9, adult: Secondary | ICD-10-CM | POA: Diagnosis not present

## 2020-07-13 DIAGNOSIS — Z0000 Encounter for general adult medical examination without abnormal findings: Secondary | ICD-10-CM | POA: Diagnosis not present

## 2020-07-13 DIAGNOSIS — E782 Mixed hyperlipidemia: Secondary | ICD-10-CM | POA: Diagnosis not present

## 2020-07-13 DIAGNOSIS — E876 Hypokalemia: Secondary | ICD-10-CM | POA: Diagnosis not present

## 2020-07-13 DIAGNOSIS — K219 Gastro-esophageal reflux disease without esophagitis: Secondary | ICD-10-CM | POA: Diagnosis not present

## 2020-07-13 DIAGNOSIS — N1831 Chronic kidney disease, stage 3a: Secondary | ICD-10-CM | POA: Diagnosis not present

## 2020-07-13 DIAGNOSIS — G8929 Other chronic pain: Secondary | ICD-10-CM | POA: Diagnosis not present

## 2020-07-19 DIAGNOSIS — J302 Other seasonal allergic rhinitis: Secondary | ICD-10-CM | POA: Diagnosis not present

## 2020-07-19 DIAGNOSIS — E876 Hypokalemia: Secondary | ICD-10-CM | POA: Diagnosis not present

## 2020-07-19 DIAGNOSIS — K219 Gastro-esophageal reflux disease without esophagitis: Secondary | ICD-10-CM | POA: Diagnosis not present

## 2020-07-19 DIAGNOSIS — G43019 Migraine without aura, intractable, without status migrainosus: Secondary | ICD-10-CM | POA: Diagnosis not present

## 2020-07-19 DIAGNOSIS — N1831 Chronic kidney disease, stage 3a: Secondary | ICD-10-CM | POA: Diagnosis not present

## 2020-07-19 DIAGNOSIS — M810 Age-related osteoporosis without current pathological fracture: Secondary | ICD-10-CM | POA: Diagnosis not present

## 2020-07-19 DIAGNOSIS — M25511 Pain in right shoulder: Secondary | ICD-10-CM | POA: Diagnosis not present

## 2020-07-19 DIAGNOSIS — E782 Mixed hyperlipidemia: Secondary | ICD-10-CM | POA: Diagnosis not present

## 2020-07-25 ENCOUNTER — Other Ambulatory Visit: Payer: Medicare HMO | Admitting: Obstetrics & Gynecology

## 2020-08-02 ENCOUNTER — Ambulatory Visit (INDEPENDENT_AMBULATORY_CARE_PROVIDER_SITE_OTHER): Payer: Medicare HMO | Admitting: Orthopedic Surgery

## 2020-08-02 ENCOUNTER — Other Ambulatory Visit: Payer: Self-pay

## 2020-08-02 DIAGNOSIS — M7501 Adhesive capsulitis of right shoulder: Secondary | ICD-10-CM

## 2020-08-03 ENCOUNTER — Encounter: Payer: Self-pay | Admitting: Orthopedic Surgery
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
## 2020-08-05 ENCOUNTER — Encounter: Payer: Self-pay | Admitting: Orthopedic Surgery

## 2020-08-05 DIAGNOSIS — M7501 Adhesive capsulitis of right shoulder: Secondary | ICD-10-CM | POA: Diagnosis not present

## 2020-08-05 MED ORDER — METHYLPREDNISOLONE ACETATE 40 MG/ML IJ SUSP
40.0000 mg | INTRAMUSCULAR | Status: AC | PRN
  Administered 2020-08-05: 40 mg via INTRA_ARTICULAR

## 2020-08-05 MED ORDER — LIDOCAINE HCL 1 % IJ SOLN
5.0000 mL | INTRAMUSCULAR | Status: AC | PRN
  Administered 2020-08-05: 5 mL

## 2020-08-05 MED ORDER — BUPIVACAINE HCL 0.5 % IJ SOLN
9.0000 mL | INTRAMUSCULAR | Status: AC | PRN
  Administered 2020-08-05: 9 mL via INTRA_ARTICULAR

## 2020-08-21 DIAGNOSIS — R109 Unspecified abdominal pain: Secondary | ICD-10-CM | POA: Diagnosis not present

## 2020-08-21 DIAGNOSIS — Z8719 Personal history of other diseases of the digestive system: Secondary | ICD-10-CM | POA: Diagnosis not present

## 2020-08-21 DIAGNOSIS — K219 Gastro-esophageal reflux disease without esophagitis: Secondary | ICD-10-CM | POA: Diagnosis not present

## 2020-08-29 ENCOUNTER — Other Ambulatory Visit (HOSPITAL_COMMUNITY): Payer: Self-pay | Admitting: Internal Medicine

## 2020-08-29 DIAGNOSIS — Z1231 Encounter for screening mammogram for malignant neoplasm of breast: Secondary | ICD-10-CM

## 2020-08-31 ENCOUNTER — Ambulatory Visit: Payer: Medicare HMO | Admitting: Podiatry

## 2020-08-31 ENCOUNTER — Encounter: Payer: Self-pay | Admitting: Podiatry

## 2020-08-31 ENCOUNTER — Other Ambulatory Visit: Payer: Self-pay

## 2020-08-31 DIAGNOSIS — M779 Enthesopathy, unspecified: Secondary | ICD-10-CM

## 2020-08-31 MED ORDER — TRIAMCINOLONE ACETONIDE 10 MG/ML IJ SUSP
10.0000 mg | Freq: Once | INTRAMUSCULAR | Status: AC
  Administered 2020-08-31: 10 mg

## 2020-09-03 NOTE — Progress Notes (Signed)
Subjective:   Patient ID: Judy Watts, female   DOB: 75 y.o.   MRN: [id number]   HPI Patient states the pain in her joint has returned and she did very well for at least 4 months   ROS      Objective:  Physical Exam  Neurovascular status intact with inflammation pain of the left sinus tarsi with fluid buildup of the joint     Assessment:  Inflammatory capsulitis of the sinus tarsi left     Plan:  H&P reviewed condition sterile prep done and injected the capsule 3 mg Kenalog 5 mg Xylocaine and advised on continued supportive shoe gear usage

## 2020-09-07 ENCOUNTER — Ambulatory Visit: Payer: Medicare HMO | Admitting: Podiatry

## 2020-09-08 ENCOUNTER — Other Ambulatory Visit: Payer: Self-pay

## 2020-09-08 ENCOUNTER — Ambulatory Visit (HOSPITAL_COMMUNITY)
Admission: RE | Admit: 2020-09-08 | Discharge: 2020-09-08 | Disposition: A | Discharge status: Home / Self Care | Payer: Medicare HMO | Source: Ambulatory Visit | Attending: Internal Medicine | Admitting: Internal Medicine

## 2020-09-08 DIAGNOSIS — Z1231 Encounter for screening mammogram for malignant neoplasm of breast: Secondary | ICD-10-CM | POA: Diagnosis not present

## 2020-09-14 DIAGNOSIS — E876 Hypokalemia: Secondary | ICD-10-CM | POA: Diagnosis not present

## 2020-09-14 DIAGNOSIS — R1030 Lower abdominal pain, unspecified: Secondary | ICD-10-CM | POA: Diagnosis not present

## 2020-09-14 DIAGNOSIS — K580 Irritable bowel syndrome with diarrhea: Secondary | ICD-10-CM | POA: Diagnosis not present

## 2020-10-10 ENCOUNTER — Other Ambulatory Visit: Payer: Self-pay | Admitting: Gastroenterology

## 2020-10-10 ENCOUNTER — Other Ambulatory Visit (HOSPITAL_COMMUNITY): Payer: Self-pay | Admitting: Gastroenterology

## 2020-10-10 DIAGNOSIS — Z8719 Personal history of other diseases of the digestive system: Secondary | ICD-10-CM

## 2020-10-10 DIAGNOSIS — R109 Unspecified abdominal pain: Secondary | ICD-10-CM

## 2020-11-01 ENCOUNTER — Ambulatory Visit (HOSPITAL_COMMUNITY): Admission: RE | Admit: 2020-11-01 | Payer: Medicare HMO | Source: Ambulatory Visit

## 2020-11-01 ENCOUNTER — Encounter (HOSPITAL_COMMUNITY): Payer: Self-pay

## 2020-11-02 ENCOUNTER — Ambulatory Visit (HOSPITAL_COMMUNITY)
Admission: RE | Admit: 2020-11-02 | Discharge: 2020-11-02 | Disposition: A | Discharge status: Home / Self Care | Payer: Medicare HMO | Source: Ambulatory Visit | Attending: Gastroenterology | Admitting: Gastroenterology

## 2020-11-02 ENCOUNTER — Other Ambulatory Visit: Payer: Self-pay

## 2020-11-02 DIAGNOSIS — Z8719 Personal history of other diseases of the digestive system: Secondary | ICD-10-CM | POA: Diagnosis not present

## 2020-11-02 DIAGNOSIS — R109 Unspecified abdominal pain: Secondary | ICD-10-CM | POA: Insufficient documentation

## 2020-11-02 DIAGNOSIS — Z23 Encounter for immunization: Secondary | ICD-10-CM | POA: Diagnosis not present

## 2020-11-02 LAB — POCT I-STAT CREATININE: Creatinine, Ser: 0.8 mg/dL (ref 0.44–1.00)

## 2020-11-02 MED ORDER — IOHEXOL 300 MG/ML  SOLN
100.0000 mL | Freq: Once | INTRAMUSCULAR | Status: AC | PRN
  Administered 2020-11-02: 100 mL via INTRAVENOUS

## 2020-11-07 DIAGNOSIS — Z0100 Encounter for examination of eyes and vision without abnormal findings: Secondary | ICD-10-CM | POA: Diagnosis not present

## 2020-11-07 DIAGNOSIS — H5200 Hypermetropia, unspecified eye: Secondary | ICD-10-CM | POA: Diagnosis not present

## 2020-11-07 DIAGNOSIS — I10 Essential (primary) hypertension: Secondary | ICD-10-CM | POA: Diagnosis not present

## 2020-11-10 DIAGNOSIS — G8929 Other chronic pain: Secondary | ICD-10-CM | POA: Diagnosis not present

## 2020-11-10 DIAGNOSIS — R309 Painful micturition, unspecified: Secondary | ICD-10-CM | POA: Diagnosis not present

## 2020-11-23 DIAGNOSIS — N898 Other specified noninflammatory disorders of vagina: Secondary | ICD-10-CM | POA: Diagnosis not present

## 2020-12-11 DIAGNOSIS — Z22322 Carrier or suspected carrier of Methicillin resistant Staphylococcus aureus: Secondary | ICD-10-CM | POA: Diagnosis not present

## 2020-12-12 ENCOUNTER — Encounter (HOSPITAL_COMMUNITY)
Admission: RE | Admit: 2020-12-12 | Discharge: 2020-12-12 | Disposition: A | Discharge status: Home / Self Care | Payer: Medicare HMO | Source: Ambulatory Visit | Attending: Internal Medicine | Admitting: Internal Medicine

## 2020-12-12 NOTE — Progress Notes (Signed)
Called Dr Juel Burrow office re: prior authorization. Message left to return call.

## 2020-12-12 NOTE — Progress Notes (Signed)
Called and left message for canceled appt due to no prior authorization. [phone] # given if any further questions. Will call pt and reschedule appt when prior authorization received. York Spaniel notified.

## 2020-12-13 ENCOUNTER — Other Ambulatory Visit: Payer: Self-pay

## 2020-12-13 ENCOUNTER — Encounter: Payer: Self-pay | Admitting: Podiatry

## 2020-12-13 ENCOUNTER — Ambulatory Visit (INDEPENDENT_AMBULATORY_CARE_PROVIDER_SITE_OTHER): Payer: Medicare HMO | Admitting: Podiatry

## 2020-12-13 DIAGNOSIS — M7752 Other enthesopathy of left foot: Secondary | ICD-10-CM

## 2020-12-13 MED ORDER — TRIAMCINOLONE ACETONIDE 10 MG/ML IJ SUSP
10.0000 mg | Freq: Once | INTRAMUSCULAR | Status: AC
  Administered 2020-12-13: 10 mg

## 2020-12-13 MED ORDER — TRIAMCINOLONE ACETONIDE 10 MG/ML IJ SUSP: 10.0000 mg | Freq: Once | INTRAMUSCULAR | Status: AC

## 2020-12-13 NOTE — Addendum Note (Signed)
Addended by: Wallene Huh on: 12/13/2020 02:40 PM   Modules accepted: Orders

## 2020-12-13 NOTE — Progress Notes (Signed)
Subjective:   Patient ID: Judy Watts, female   DOB: 75 y.o.   MRN: [id number]   HPI Patient presents with a lot of discomfort in the left ankle states its been inflamed and hard to walk with and stated it just started recently and she had over 3 months of good relief and may have just done some stuff that caused it to do this   ROS      Objective:  Physical Exam  Neurovascular status intact with inflammation of the left sinus tarsi fluid buildup     Assessment:  Inflammatory sinus tarsitis capsulitis left subtalar joint     Plan:  H&P sterile prep and injected the joint 3 mg Kenalog 5 g Xylocaine advised that like to wait at least 4 months before considering this again and reappoint as needed

## 2020-12-26 ENCOUNTER — Encounter (HOSPITAL_COMMUNITY)
Admission: RE | Admit: 2020-12-26 | Discharge: 2020-12-26 | Disposition: A | Discharge status: Home / Self Care | Payer: Medicare HMO | Source: Ambulatory Visit | Attending: Internal Medicine | Admitting: Internal Medicine

## 2020-12-26 ENCOUNTER — Other Ambulatory Visit: Payer: Self-pay

## 2020-12-26 DIAGNOSIS — M810 Age-related osteoporosis without current pathological fracture: Secondary | ICD-10-CM | POA: Diagnosis not present

## 2020-12-26 MED ORDER — DENOSUMAB 60 MG/ML ~~LOC~~ SOSY
PREFILLED_SYRINGE | SUBCUTANEOUS | Status: AC
  Filled 2020-12-26: qty 1

## 2020-12-26 MED ORDER — DENOSUMAB 60 MG/ML ~~LOC~~ SOSY
60.0000 mg | PREFILLED_SYRINGE | Freq: Once | SUBCUTANEOUS | Status: AC
  Administered 2020-12-26: 60 mg via SUBCUTANEOUS

## 2021-01-10 DIAGNOSIS — E782 Mixed hyperlipidemia: Secondary | ICD-10-CM | POA: Diagnosis not present

## 2021-01-22 DIAGNOSIS — N1831 Chronic kidney disease, stage 3a: Secondary | ICD-10-CM | POA: Diagnosis not present

## 2021-01-22 DIAGNOSIS — M25511 Pain in right shoulder: Secondary | ICD-10-CM | POA: Diagnosis not present

## 2021-01-22 DIAGNOSIS — E870 Hyperosmolality and hypernatremia: Secondary | ICD-10-CM | POA: Diagnosis not present

## 2021-01-22 DIAGNOSIS — M810 Age-related osteoporosis without current pathological fracture: Secondary | ICD-10-CM | POA: Diagnosis not present

## 2021-01-22 DIAGNOSIS — J302 Other seasonal allergic rhinitis: Secondary | ICD-10-CM | POA: Diagnosis not present

## 2021-01-22 DIAGNOSIS — E876 Hypokalemia: Secondary | ICD-10-CM | POA: Diagnosis not present

## 2021-01-22 DIAGNOSIS — K219 Gastro-esophageal reflux disease without esophagitis: Secondary | ICD-10-CM | POA: Diagnosis not present

## 2021-01-22 DIAGNOSIS — G43019 Migraine without aura, intractable, without status migrainosus: Secondary | ICD-10-CM | POA: Diagnosis not present

## 2021-01-22 DIAGNOSIS — E782 Mixed hyperlipidemia: Secondary | ICD-10-CM | POA: Diagnosis not present

## 2021-02-26 ENCOUNTER — Ambulatory Visit: Payer: Medicare HMO | Admitting: Podiatry

## 2021-02-26 ENCOUNTER — Other Ambulatory Visit: Payer: Self-pay

## 2021-02-26 ENCOUNTER — Encounter: Payer: Self-pay | Admitting: Podiatry

## 2021-02-26 DIAGNOSIS — M778 Other enthesopathies, not elsewhere classified: Secondary | ICD-10-CM

## 2021-02-26 DIAGNOSIS — M7752 Other enthesopathy of left foot: Secondary | ICD-10-CM | POA: Diagnosis not present

## 2021-02-26 MED ORDER — TRIAMCINOLONE ACETONIDE 10 MG/ML IJ SUSP
20.0000 mg | Freq: Once | INTRAMUSCULAR | Status: AC
  Administered 2021-02-26: 20 mg

## 2021-02-28 NOTE — Progress Notes (Signed)
Subjective:   Patient ID: Judy Watts, female   DOB: 75 y.o.   MRN: [id number]   HPI Patient states she has had more pain in her left ankle and its been inflamed and also has developed pain in the outside of her left foot with fluid buildup which is probably walking differently.  Patient states she does not feel like she has enough stability around the ankle and she has too much motion and that is creating the problem and the pain is slightly different over where it was   ROS      Objective:  Physical Exam  Neurovascular status intact with inflammation pain more of the left ankle versus the sinus tarsi currently with inflammation pain around the fifth MPJ left with fluid buildup of the joint surface     Assessment:  Inflammatory capsulitis of the left ankle and the left fifth MPJ with pain     Plan:  H&P reviewed both conditions independently and for the ankle I did sterile prep and injected the left ankle dorsal capsule 3 mg Kenalog 5 mg Xylocaine and injected the fifth MPJ left 3 mg Dexasone Kenalog 5 mg Xylocaine and applied fascial brace to hold the arch in a more stable position and reappoint to recheck  X-rays were negative for any further changes from an arthritic standpoint or stress fracture standpoint

## 2021-03-19 DIAGNOSIS — I10 Essential (primary) hypertension: Secondary | ICD-10-CM | POA: Diagnosis not present

## 2021-03-19 DIAGNOSIS — M25552 Pain in left hip: Secondary | ICD-10-CM | POA: Diagnosis not present

## 2021-03-19 DIAGNOSIS — H6122 Impacted cerumen, left ear: Secondary | ICD-10-CM | POA: Diagnosis not present

## 2021-03-19 DIAGNOSIS — M25551 Pain in right hip: Secondary | ICD-10-CM | POA: Diagnosis not present

## 2021-05-28 DIAGNOSIS — M545 Low back pain, unspecified: Secondary | ICD-10-CM | POA: Diagnosis not present

## 2021-05-28 DIAGNOSIS — G8929 Other chronic pain: Secondary | ICD-10-CM | POA: Diagnosis not present

## 2021-05-28 DIAGNOSIS — M25552 Pain in left hip: Secondary | ICD-10-CM | POA: Diagnosis not present

## 2021-05-28 DIAGNOSIS — M25572 Pain in left ankle and joints of left foot: Secondary | ICD-10-CM | POA: Diagnosis not present

## 2021-06-05 DIAGNOSIS — M5442 Lumbago with sciatica, left side: Secondary | ICD-10-CM | POA: Diagnosis not present

## 2021-06-05 DIAGNOSIS — M4150 Other secondary scoliosis, site unspecified: Secondary | ICD-10-CM | POA: Diagnosis not present

## 2021-06-05 DIAGNOSIS — G8929 Other chronic pain: Secondary | ICD-10-CM | POA: Diagnosis not present

## 2021-06-08 ENCOUNTER — Other Ambulatory Visit: Payer: Self-pay | Admitting: Neurosurgery

## 2021-06-08 ENCOUNTER — Other Ambulatory Visit (HOSPITAL_COMMUNITY): Payer: Self-pay | Admitting: Neurosurgery

## 2021-06-08 DIAGNOSIS — G8929 Other chronic pain: Secondary | ICD-10-CM

## 2021-06-08 DIAGNOSIS — M4150 Other secondary scoliosis, site unspecified: Secondary | ICD-10-CM

## 2021-06-14 ENCOUNTER — Ambulatory Visit: Payer: Medicare HMO | Admitting: Podiatry

## 2021-06-14 ENCOUNTER — Other Ambulatory Visit: Payer: Self-pay

## 2021-06-14 ENCOUNTER — Encounter: Payer: Self-pay | Admitting: Podiatry

## 2021-06-14 DIAGNOSIS — M7752 Other enthesopathy of left foot: Secondary | ICD-10-CM | POA: Diagnosis not present

## 2021-06-14 MED ORDER — TRIAMCINOLONE ACETONIDE 10 MG/ML IJ SUSP
10.0000 mg | Freq: Once | INTRAMUSCULAR | Status: AC
  Administered 2021-06-14: 15:00:00 10 mg

## 2021-06-14 NOTE — Progress Notes (Signed)
Subjective:   Patient ID: Judy Watts, female   DOB: 75 y.o.   MRN: [id number]   HPI Patient presents stating that the left ankle that we worked on is done well but now it is back to the other joint that we worked on previously   ROS      Objective:  Physical Exam  Neurovascular status intact with improvement of the left ankle with pain which is now settled back into the sinus tarsi     Assessment:  Inflow Tory sinus tarsitis left improved ankle     Plan:  Sterile prep injected the sinus tarsi left 3 mg Kenalog 5 mg Xylocaine reappoint to recheck overall pleased with response

## 2021-06-22 ENCOUNTER — Ambulatory Visit (HOSPITAL_COMMUNITY)
Admission: RE | Admit: 2021-06-22 | Discharge: 2021-06-22 | Disposition: A | Discharge status: Home / Self Care | Payer: Medicare HMO | Source: Ambulatory Visit | Attending: Neurosurgery | Admitting: Neurosurgery

## 2021-06-22 ENCOUNTER — Other Ambulatory Visit: Payer: Self-pay

## 2021-06-22 DIAGNOSIS — M4150 Other secondary scoliosis, site unspecified: Secondary | ICD-10-CM | POA: Insufficient documentation

## 2021-06-22 DIAGNOSIS — G8929 Other chronic pain: Secondary | ICD-10-CM | POA: Insufficient documentation

## 2021-06-22 DIAGNOSIS — M4186 Other forms of scoliosis, lumbar region: Secondary | ICD-10-CM | POA: Diagnosis not present

## 2021-06-22 DIAGNOSIS — M5442 Lumbago with sciatica, left side: Secondary | ICD-10-CM

## 2021-06-22 DIAGNOSIS — M4317 Spondylolisthesis, lumbosacral region: Secondary | ICD-10-CM | POA: Diagnosis not present

## 2021-06-22 DIAGNOSIS — M47816 Spondylosis without myelopathy or radiculopathy, lumbar region: Secondary | ICD-10-CM | POA: Diagnosis not present

## 2021-06-28 ENCOUNTER — Encounter (HOSPITAL_COMMUNITY)
Admission: RE | Admit: 2021-06-28 | Discharge: 2021-06-28 | Disposition: A | Discharge status: Home / Self Care | Payer: Medicare HMO | Source: Ambulatory Visit | Attending: Internal Medicine | Admitting: Internal Medicine

## 2021-07-05 ENCOUNTER — Encounter (HOSPITAL_COMMUNITY)
Admission: RE | Admit: 2021-07-05 | Discharge: 2021-07-05 | Disposition: A | Discharge status: Home / Self Care | Payer: Medicare HMO | Source: Ambulatory Visit | Attending: Internal Medicine | Admitting: Internal Medicine

## 2021-07-05 DIAGNOSIS — M810 Age-related osteoporosis without current pathological fracture: Secondary | ICD-10-CM | POA: Diagnosis not present

## 2021-07-05 MED ORDER — DENOSUMAB 60 MG/ML ~~LOC~~ SOSY
60.0000 mg | PREFILLED_SYRINGE | Freq: Once | SUBCUTANEOUS | Status: AC
  Administered 2021-07-05: 60 mg via SUBCUTANEOUS
  Filled 2021-07-05: qty 1

## 2021-07-10 DIAGNOSIS — M4807 Spinal stenosis, lumbosacral region: Secondary | ICD-10-CM | POA: Diagnosis not present

## 2021-07-10 DIAGNOSIS — M4317 Spondylolisthesis, lumbosacral region: Secondary | ICD-10-CM | POA: Diagnosis not present

## 2021-07-10 DIAGNOSIS — R030 Elevated blood-pressure reading, without diagnosis of hypertension: Secondary | ICD-10-CM | POA: Diagnosis not present

## 2021-07-10 DIAGNOSIS — M5417 Radiculopathy, lumbosacral region: Secondary | ICD-10-CM | POA: Diagnosis not present

## 2021-07-19 ENCOUNTER — Other Ambulatory Visit: Payer: Self-pay | Admitting: Neurosurgery

## 2021-07-20 ENCOUNTER — Other Ambulatory Visit: Payer: Self-pay | Admitting: Neurosurgery

## 2021-07-20 DIAGNOSIS — E785 Hyperlipidemia, unspecified: Secondary | ICD-10-CM | POA: Diagnosis not present

## 2021-07-25 DIAGNOSIS — K219 Gastro-esophageal reflux disease without esophagitis: Secondary | ICD-10-CM | POA: Diagnosis not present

## 2021-07-25 DIAGNOSIS — N1831 Chronic kidney disease, stage 3a: Secondary | ICD-10-CM | POA: Diagnosis not present

## 2021-07-25 DIAGNOSIS — J302 Other seasonal allergic rhinitis: Secondary | ICD-10-CM | POA: Diagnosis not present

## 2021-07-25 DIAGNOSIS — E876 Hypokalemia: Secondary | ICD-10-CM | POA: Diagnosis not present

## 2021-07-25 DIAGNOSIS — M810 Age-related osteoporosis without current pathological fracture: Secondary | ICD-10-CM | POA: Diagnosis not present

## 2021-07-25 DIAGNOSIS — E870 Hyperosmolality and hypernatremia: Secondary | ICD-10-CM | POA: Diagnosis not present

## 2021-07-25 DIAGNOSIS — G43019 Migraine without aura, intractable, without status migrainosus: Secondary | ICD-10-CM | POA: Diagnosis not present

## 2021-07-25 DIAGNOSIS — M25511 Pain in right shoulder: Secondary | ICD-10-CM | POA: Diagnosis not present

## 2021-07-25 DIAGNOSIS — E782 Mixed hyperlipidemia: Secondary | ICD-10-CM | POA: Diagnosis not present

## 2021-08-10 NOTE — Progress Notes (Signed)
Surgical Instructions    Your procedure is scheduled on Wednesday, March 1st, 2023.   Report to [facility] Main Entrance "A" at 09:45 A.M., then check in with the Admitting office.  Call this number if you have problems the morning of surgery:  [phone]   If you have any questions prior to your surgery date call [phone]: Open Monday-Friday 8am-4pm    Remember:  Do not eat or drink after midnight the night before your surgery    Take these medicines the morning of surgery with A SIP OF WATER:   gabapentin (NEURONTIN)  omeprazole (PRILOSEC) propranolol (INDERAL)  topiramate (TOPAMAX)   If needed:  HYDROcodone-acetaminophen (NORCO) azelastine (ASTELIN)   Follow your surgeon's instructions on when to stop Aspirin.  If no instructions were given by your surgeon then you will need to call the office to get those instructions.     As of today, STOP taking any Aspirin (unless otherwise instructed by your surgeon) Aleve, Naproxen, Ibuprofen, Motrin, Advil, Goody's, BC's, all herbal medications, fish oil, and all vitamins.   The day of surgery:         Do not wear jewelry or makeup Do not wear lotions, powders, perfumes, or deodorant. Do not shave 48 hours prior to surgery.   Do not bring valuables to the hospital. Do not wear nail polish, gel polish, artificial nails, or any other type of covering on natural nails (fingers and toes) If you have artificial nails or gel coating that need to be removed by a nail salon, please have this removed prior to surgery. Artificial nails or gel coating may interfere with anesthesia's ability to adequately monitor your vital signs.   [facility] is not responsible for any belongings or valuables. .   Do NOT Smoke (Tobacco/Vaping)  24 hours prior to your procedure  If you use a CPAP at night, you may bring your mask for your overnight stay.   Contacts, glasses, hearing aids, dentures or partials may not be worn into surgery, please  bring cases for these belongings   For patients admitted to the hospital, discharge time will be determined by your treatment team.   Patients discharged the day of surgery will not be allowed to drive home, and someone needs to stay with them for 24 hours.  NO VISITORS WILL BE ALLOWED IN PRE-OP WHERE PATIENTS ARE PREPPED FOR SURGERY.  ONLY 1 SUPPORT PERSON MAY BE PRESENT IN THE WAITING ROOM WHILE YOU ARE IN SURGERY.  IF YOU ARE TO BE ADMITTED, ONCE YOU ARE IN YOUR ROOM YOU WILL BE ALLOWED TWO (2) VISITORS. 1 (ONE) VISITOR MAY STAY OVERNIGHT BUT MUST ARRIVE TO THE ROOM BY 8pm.  Minor children may have two parents present. Special consideration for safety and communication needs will be reviewed on a case by case basis.  Special instructions:    Oral Hygiene is also important to reduce your risk of infection.  Remember - BRUSH YOUR TEETH THE MORNING OF SURGERY WITH YOUR REGULAR TOOTHPASTE   [facility]- Preparing For Surgery  Before surgery, you can play an important role. Because skin is not sterile, your skin needs to be as free of germs as possible. You can reduce the number of germs on your skin by washing with CHG (chlorahexidine gluconate) Soap before surgery.  CHG is an antiseptic cleaner which kills germs and bonds with the skin to continue killing germs even after washing.     Please do not use if you have an allergy to  CHG or antibacterial soaps. If your skin becomes reddened/irritated stop using the CHG.  Do not shave (including legs and underarms) for at least 48 hours prior to first CHG shower. It is OK to shave your face.  Please follow these instructions carefully.     Shower the NIGHT BEFORE SURGERY and the MORNING OF SURGERY with CHG Soap.   If you chose to wash your hair, wash your hair first as usual with your normal shampoo. After you shampoo, rinse your hair and body thoroughly to remove the shampoo.  Then [employer] and genitals (private parts) with your normal soap and  rinse thoroughly to remove soap.  After that Use CHG Soap as you would any other liquid soap. You can apply CHG directly to the skin and wash gently with a scrungie or a clean washcloth.   Apply the CHG Soap to your body ONLY FROM THE NECK DOWN.  Do not use on open wounds or open sores. Avoid contact with your eyes, ears, mouth and genitals (private parts). Wash Face and genitals (private parts)  with your normal soap.   Wash thoroughly, paying special attention to the area where your surgery will be performed.  Thoroughly rinse your body with warm water from the neck down.  DO NOT shower/wash with your normal soap after using and rinsing off the CHG Soap.  Pat yourself dry with a CLEAN TOWEL.  Wear CLEAN PAJAMAS to bed the night before surgery  Place CLEAN SHEETS on your bed the night before your surgery  DO NOT SLEEP WITH PETS.   Day of Surgery:  Take a shower with CHG soap. Wear Clean/Comfortable clothing the morning of surgery Do not apply any deodorants/lotions.   Remember to brush your teeth WITH YOUR REGULAR TOOTHPASTE.    COVID testing  If you are going to stay overnight or be admitted after your procedure/surgery and require a pre-op COVID test, please follow these instructions after your COVID test   You are not required to quarantine however you are required to wear a well-fitting mask when you are out and around people not in your household.  If your mask becomes wet or soiled, replace with a new one.  Wash your hands often with soap and water for 20 seconds or clean your hands with an alcohol-based hand sanitizer that contains at least 60% alcohol.  Do not share personal items.  Notify your provider: if you are in close contact with someone who has COVID  or if you develop a fever of 100.4 or greater, sneezing, cough, sore throat, shortness of breath or body aches.    Please read over the following fact sheets that you were given.

## 2021-08-13 ENCOUNTER — Other Ambulatory Visit: Payer: Self-pay

## 2021-08-13 ENCOUNTER — Encounter (HOSPITAL_COMMUNITY): Payer: Self-pay

## 2021-08-13 ENCOUNTER — Encounter (HOSPITAL_COMMUNITY)
Admission: RE | Admit: 2021-08-13 | Discharge: 2021-08-13 | Disposition: A | Discharge status: Home / Self Care | Payer: Medicare HMO | Source: Ambulatory Visit | Attending: Neurosurgery | Admitting: Neurosurgery

## 2021-08-13 VITALS — BP 134/72 | HR 60 | Temp 98.3°F | Resp 18 | Ht 64.0 in | Wt 133.3 lb

## 2021-08-13 DIAGNOSIS — Z20822 Contact with and (suspected) exposure to covid-19: Secondary | ICD-10-CM | POA: Diagnosis not present

## 2021-08-13 DIAGNOSIS — Z01818 Encounter for other preprocedural examination: Secondary | ICD-10-CM | POA: Insufficient documentation

## 2021-08-13 HISTORY — DX: Unspecified osteoarthritis, unspecified site: M19.90

## 2021-08-13 LAB — BASIC METABOLIC PANEL
Anion gap: 10 (ref 5–15)
BUN: 14 mg/dL (ref 8–23)
CO2: 29 mmol/L (ref 22–32)
Calcium: 9.8 mg/dL (ref 8.9–10.3)
Chloride: 104 mmol/L (ref 98–111)
Creatinine, Ser: 0.82 mg/dL (ref 0.44–1.00)
GFR, Estimated: >60 mL/min (ref >60)
Glucose, Bld: 88 mg/dL (ref 70–99)
Potassium: 3.1 mmol/L — ABNORMAL LOW (ref 3.5–5.1)
Sodium: 143 mmol/L (ref 135–145)

## 2021-08-13 LAB — CBC
HCT: 40.4 % (ref 36.0–46.0)
Hemoglobin: 13.5 g/dL (ref 12.0–15.0)
MCH: 31 pg (ref 26.0–34.0)
MCHC: 33.4 g/dL (ref 30.0–36.0)
MCV: 92.7 fL (ref 80.0–100.0)
Platelets: 264 10*3/uL (ref 150–400)
RBC: 4.36 MIL/uL (ref 3.87–5.11)
RDW: 13.4 % (ref 11.5–15.5)
WBC: 8.4 10*3/uL (ref 4.0–10.5)
nRBC: 0 % (ref 0.0–0.2)

## 2021-08-13 LAB — TYPE AND SCREEN
ABO/RH(D): B NEG
Antibody Screen: NEGATIVE

## 2021-08-13 LAB — SURGICAL PCR SCREEN
MRSA, PCR: POSITIVE — AB
Staphylococcus aureus: POSITIVE — AB

## 2021-08-13 LAB — SARS CORONAVIRUS 2 BY RT PCR (HOSPITAL ORDER, PERFORMED IN ~~LOC~~ HOSPITAL LAB): SARS Coronavirus 2: NEGATIVE

## 2021-08-13 NOTE — Progress Notes (Addendum)
PCP - Allyn Kenner, MD Cardiologist - denies  PPM/ICD - denies Device Orders - n/a Rep Notified - n/a  Chest x-ray - n/a EKG - 08/13/2021 Stress Test - denies ECHO - denies Cardiac Cath - denies  Sleep Study - denies CPAP - n/a  Fasting Blood Sugar - n/a  Blood Thinner Instructions: n/a  Aspirin Instructions: last dose - 08/08/2021 per patient  Patient was instructed: As of today, STOP taking any Aspirin (unless otherwise instructed by your surgeon) Aleve, Naproxen, Ibuprofen, Motrin, Advil, Goody's, BC's, all herbal medications, fish oil, and all vitamins.  ERAS Protcol - n/a  COVID TEST- done in PAT on 08/13/2021   Anesthesia review: yes - abnormal EKG in PAT.  Patient denies shortness of breath, fever, cough and chest pain at PAT appointment   All instructions explained to the patient, with a verbal understanding of the material. Patient agrees to go over the instructions while at home for a better understanding. Patient also instructed to self quarantine after being tested for COVID-19. The opportunity to ask questions was provided.

## 2021-08-14 NOTE — Progress Notes (Signed)
Spoke with Nicki at Dr. Arnoldo Morale office and gave her positive MRSA report on this patient.

## 2021-08-15 ENCOUNTER — Other Ambulatory Visit: Payer: Self-pay

## 2021-08-15 ENCOUNTER — Ambulatory Visit (HOSPITAL_COMMUNITY): Payer: Medicare HMO | Admitting: Physician Assistant

## 2021-08-15 ENCOUNTER — Ambulatory Visit (HOSPITAL_COMMUNITY): Payer: Medicare HMO

## 2021-08-15 ENCOUNTER — Ambulatory Visit (HOSPITAL_BASED_OUTPATIENT_CLINIC_OR_DEPARTMENT_OTHER): Payer: Medicare HMO | Admitting: Anesthesiology

## 2021-08-15 ENCOUNTER — Ambulatory Visit (HOSPITAL_COMMUNITY)
Admission: RE | Disposition: A | Discharge status: Home / Self Care | Payer: Self-pay | Source: Home / Self Care | Attending: Neurosurgery

## 2021-08-15 ENCOUNTER — Ambulatory Visit (HOSPITAL_COMMUNITY)
Admission: RE | Admit: 2021-08-15 | Discharge: 2021-08-16 | Disposition: A | Discharge status: Home / Self Care | Payer: Medicare HMO | Attending: Neurosurgery | Admitting: Neurosurgery

## 2021-08-15 ENCOUNTER — Encounter (HOSPITAL_COMMUNITY): Payer: Self-pay | Admitting: Neurosurgery

## 2021-08-15 DIAGNOSIS — M4327 Fusion of spine, lumbosacral region: Secondary | ICD-10-CM | POA: Diagnosis not present

## 2021-08-15 DIAGNOSIS — Z79899 Other long term (current) drug therapy: Secondary | ICD-10-CM | POA: Diagnosis not present

## 2021-08-15 DIAGNOSIS — R519 Headache, unspecified: Secondary | ICD-10-CM | POA: Insufficient documentation

## 2021-08-15 DIAGNOSIS — M4157 Other secondary scoliosis, lumbosacral region: Secondary | ICD-10-CM | POA: Diagnosis not present

## 2021-08-15 DIAGNOSIS — M48062 Spinal stenosis, lumbar region with neurogenic claudication: Secondary | ICD-10-CM | POA: Diagnosis not present

## 2021-08-15 DIAGNOSIS — M4807 Spinal stenosis, lumbosacral region: Secondary | ICD-10-CM

## 2021-08-15 DIAGNOSIS — M4187 Other forms of scoliosis, lumbosacral region: Secondary | ICD-10-CM | POA: Diagnosis not present

## 2021-08-15 DIAGNOSIS — M4317 Spondylolisthesis, lumbosacral region: Secondary | ICD-10-CM | POA: Diagnosis not present

## 2021-08-15 DIAGNOSIS — Z419 Encounter for procedure for purposes other than remedying health state, unspecified: Secondary | ICD-10-CM

## 2021-08-15 DIAGNOSIS — M4326 Fusion of spine, lumbar region: Secondary | ICD-10-CM | POA: Diagnosis not present

## 2021-08-15 DIAGNOSIS — K219 Gastro-esophageal reflux disease without esophagitis: Secondary | ICD-10-CM | POA: Insufficient documentation

## 2021-08-15 DIAGNOSIS — I10 Essential (primary) hypertension: Secondary | ICD-10-CM | POA: Diagnosis not present

## 2021-08-15 DIAGNOSIS — M419 Scoliosis, unspecified: Secondary | ICD-10-CM | POA: Diagnosis not present

## 2021-08-15 DIAGNOSIS — M5127 Other intervertebral disc displacement, lumbosacral region: Secondary | ICD-10-CM

## 2021-08-15 DIAGNOSIS — M7752 Other enthesopathy of left foot: Secondary | ICD-10-CM

## 2021-08-15 DIAGNOSIS — Z981 Arthrodesis status: Secondary | ICD-10-CM | POA: Diagnosis not present

## 2021-08-15 DIAGNOSIS — M5117 Intervertebral disc disorders with radiculopathy, lumbosacral region: Secondary | ICD-10-CM | POA: Insufficient documentation

## 2021-08-15 LAB — ABO/RH: ABO/RH(D): B NEG

## 2021-08-15 SURGERY — POSTERIOR LUMBAR FUSION 1 LEVEL
Anesthesia: General | Site: Spine Lumbar

## 2021-08-15 MED ORDER — CHLORHEXIDINE GLUCONATE CLOTH 2 % EX PADS: 6.0000 | MEDICATED_PAD | Freq: Once | CUTANEOUS | Status: DC

## 2021-08-15 MED ORDER — VANCOMYCIN HCL IN DEXTROSE 1-5 GM/200ML-% IV SOLN
1000.0000 mg | Freq: Two times a day (BID) | INTRAVENOUS | Status: AC
  Administered 2021-08-15: 1000 mg via INTRAVENOUS
  Filled 2021-08-15: qty 200

## 2021-08-15 MED ORDER — DICYCLOMINE HCL 10 MG PO CAPS
10.0000 mg | ORAL_CAPSULE | Freq: Three times a day (TID) | ORAL | Status: DC
  Administered 2021-08-15: 10 mg via ORAL
  Filled 2021-08-15: qty 1

## 2021-08-15 MED ORDER — MONTELUKAST SODIUM 10 MG PO TABS
10.0000 mg | ORAL_TABLET | Freq: Every day | ORAL | Status: DC
  Administered 2021-08-15: 10 mg via ORAL
  Filled 2021-08-15: qty 1

## 2021-08-15 MED ORDER — LIDOCAINE 2% (20 MG/ML) 5 ML SYRINGE
INTRAMUSCULAR | Status: AC
  Filled 2021-08-15: qty 5

## 2021-08-15 MED ORDER — HYDROMORPHONE HCL 1 MG/ML IJ SOLN
0.2500 mg | INTRAMUSCULAR | Status: DC | PRN
  Administered 2021-08-15: 0.25 mg via INTRAVENOUS

## 2021-08-15 MED ORDER — EPHEDRINE 5 MG/ML INJ
INTRAVENOUS | Status: AC
  Filled 2021-08-15: qty 5

## 2021-08-15 MED ORDER — BUPIVACAINE-EPINEPHRINE 0.5% -1:200000 IJ SOLN
INTRAMUSCULAR | Status: AC
  Filled 2021-08-15: qty 1

## 2021-08-15 MED ORDER — PROPOFOL 10 MG/ML IV BOLUS
INTRAVENOUS | Status: AC
  Filled 2021-08-15: qty 40

## 2021-08-15 MED ORDER — SUGAMMADEX SODIUM 200 MG/2ML IV SOLN
INTRAVENOUS | Status: DC | PRN
  Administered 2021-08-15: 200 mg via INTRAVENOUS

## 2021-08-15 MED ORDER — ACETAMINOPHEN 500 MG PO TABS
1000.0000 mg | ORAL_TABLET | Freq: Once | ORAL | Status: AC
  Administered 2021-08-15: 1000 mg via ORAL
  Filled 2021-08-15: qty 2

## 2021-08-15 MED ORDER — 0.9 % SODIUM CHLORIDE (POUR BTL) OPTIME
TOPICAL | Status: DC | PRN
  Administered 2021-08-15: 1000 mL

## 2021-08-15 MED ORDER — OXYCODONE HCL 5 MG PO TABS: 5.0000 mg | ORAL_TABLET | Freq: Once | ORAL | Status: DC | PRN

## 2021-08-15 MED ORDER — FENTANYL CITRATE (PF) 250 MCG/5ML IJ SOLN
INTRAMUSCULAR | Status: AC
  Filled 2021-08-15: qty 5

## 2021-08-15 MED ORDER — ONDANSETRON HCL 4 MG/2ML IJ SOLN
INTRAMUSCULAR | Status: DC | PRN
  Administered 2021-08-15: 4 mg via INTRAVENOUS

## 2021-08-15 MED ORDER — BUPIVACAINE HCL (PF) 0.5 % IJ SOLN
INTRAMUSCULAR | Status: AC
  Filled 2021-08-15: qty 30

## 2021-08-15 MED ORDER — BISACODYL 10 MG RE SUPP
10.0000 mg | Freq: Every day | RECTAL | Status: DC | PRN
Start: 2021-08-15 — End: 2021-08-16

## 2021-08-15 MED ORDER — HYDROCHLOROTHIAZIDE 25 MG PO TABS
25.0000 mg | ORAL_TABLET | Freq: Every day | ORAL | Status: DC
  Administered 2021-08-15: 25 mg via ORAL
  Filled 2021-08-15: qty 1

## 2021-08-15 MED ORDER — BUPIVACAINE-EPINEPHRINE (PF) 0.5% -1:200000 IJ SOLN
INTRAMUSCULAR | Status: DC | PRN
  Administered 2021-08-15: 10 mL

## 2021-08-15 MED ORDER — AMISULPRIDE (ANTIEMETIC) 5 MG/2ML IV SOLN: 10.0000 mg | Freq: Once | INTRAVENOUS | Status: DC | PRN

## 2021-08-15 MED ORDER — MENTHOL 3 MG MT LOZG: 1.0000 | LOZENGE | OROMUCOSAL | Status: DC | PRN

## 2021-08-15 MED ORDER — THROMBIN 5000 UNITS EX SOLR
CUTANEOUS | Status: AC
  Filled 2021-08-15: qty 5000

## 2021-08-15 MED ORDER — GABAPENTIN 300 MG PO CAPS
300.0000 mg | ORAL_CAPSULE | Freq: Four times a day (QID) | ORAL | Status: DC
  Administered 2021-08-15 (×2): 300 mg via ORAL
  Filled 2021-08-15 (×2): qty 1

## 2021-08-15 MED ORDER — CHLORHEXIDINE GLUCONATE 0.12 % MT SOLN
15.0000 mL | Freq: Once | OROMUCOSAL | Status: AC
  Administered 2021-08-15: 15 mL via OROMUCOSAL
  Filled 2021-08-15: qty 15

## 2021-08-15 MED ORDER — DOCUSATE SODIUM 100 MG PO CAPS
100.0000 mg | ORAL_CAPSULE | Freq: Two times a day (BID) | ORAL | Status: DC
  Administered 2021-08-15: 100 mg via ORAL
  Filled 2021-08-15: qty 1

## 2021-08-15 MED ORDER — GLYCOPYRROLATE PF 0.2 MG/ML IJ SOSY
PREFILLED_SYRINGE | INTRAMUSCULAR | Status: DC | PRN
Start: 2021-08-15 — End: 2021-08-15
  Administered 2021-08-15: .2 mg via INTRAVENOUS

## 2021-08-15 MED ORDER — EPHEDRINE SULFATE (PRESSORS) 50 MG/ML IJ SOLN
INTRAMUSCULAR | Status: DC | PRN
  Administered 2021-08-15 (×2): 5 mg via INTRAVENOUS
  Administered 2021-08-15: 10 mg via INTRAVENOUS
  Administered 2021-08-15: 5 mg via INTRAVENOUS

## 2021-08-15 MED ORDER — ALBUMIN HUMAN 5 % IV SOLN: INTRAVENOUS | Status: DC | PRN

## 2021-08-15 MED ORDER — CEFAZOLIN SODIUM-DEXTROSE 2-4 GM/100ML-% IV SOLN
2.0000 g | INTRAVENOUS | Status: AC
  Administered 2021-08-15: 2 g via INTRAVENOUS
  Filled 2021-08-15: qty 100

## 2021-08-15 MED ORDER — CEFAZOLIN SODIUM-DEXTROSE 2-4 GM/100ML-% IV SOLN
2.0000 g | Freq: Three times a day (TID) | INTRAVENOUS | Status: AC
  Administered 2021-08-15 – 2021-08-16 (×2): 2 g via INTRAVENOUS
  Filled 2021-08-15 (×2): qty 100

## 2021-08-15 MED ORDER — PHENOL 1.4 % MT LIQD: 1.0000 | OROMUCOSAL | Status: DC | PRN

## 2021-08-15 MED ORDER — HYDROCODONE-ACETAMINOPHEN 7.5-325 MG PO TABS: 1.0000 | ORAL_TABLET | ORAL | Status: DC | PRN

## 2021-08-15 MED ORDER — ROCURONIUM BROMIDE 10 MG/ML (PF) SYRINGE
PREFILLED_SYRINGE | INTRAVENOUS | Status: DC | PRN
Start: 2021-08-15 — End: 2021-08-15
  Administered 2021-08-15 (×3): 20 mg via INTRAVENOUS
  Administered 2021-08-15: 60 mg via INTRAVENOUS
  Administered 2021-08-15: 20 mg via INTRAVENOUS

## 2021-08-15 MED ORDER — KETAMINE HCL 50 MG/5ML IJ SOSY
PREFILLED_SYRINGE | INTRAMUSCULAR | Status: AC
  Filled 2021-08-15: qty 5

## 2021-08-15 MED ORDER — PHENYLEPHRINE HCL-NACL 20-0.9 MG/250ML-% IV SOLN
INTRAVENOUS | Status: DC | PRN
  Administered 2021-08-15: 25 ug/min via INTRAVENOUS

## 2021-08-15 MED ORDER — PANTOPRAZOLE SODIUM 40 MG PO TBEC
80.0000 mg | DELAYED_RELEASE_TABLET | Freq: Every day | ORAL | Status: DC
  Administered 2021-08-15: 80 mg via ORAL
  Filled 2021-08-15: qty 2

## 2021-08-15 MED ORDER — TOPIRAMATE 25 MG PO TABS
25.0000 mg | ORAL_TABLET | Freq: Three times a day (TID) | ORAL | Status: DC
  Administered 2021-08-15: 25 mg via ORAL
  Filled 2021-08-15: qty 1

## 2021-08-15 MED ORDER — ACETAMINOPHEN 500 MG PO TABS
1000.0000 mg | ORAL_TABLET | Freq: Four times a day (QID) | ORAL | Status: DC
  Administered 2021-08-15 – 2021-08-16 (×3): 1000 mg via ORAL
  Filled 2021-08-15 (×3): qty 2

## 2021-08-15 MED ORDER — ZOLPIDEM TARTRATE 5 MG PO TABS: 5.0000 mg | ORAL_TABLET | Freq: Every evening | ORAL | Status: DC | PRN

## 2021-08-15 MED ORDER — OXYCODONE HCL 5 MG PO TABS: 5.0000 mg | ORAL_TABLET | ORAL | Status: DC | PRN

## 2021-08-15 MED ORDER — VANCOMYCIN HCL IN DEXTROSE 1-5 GM/200ML-% IV SOLN
1000.0000 mg | INTRAVENOUS | Status: AC
  Administered 2021-08-15: 1000 mg via INTRAVENOUS
  Filled 2021-08-15: qty 200

## 2021-08-15 MED ORDER — KETAMINE HCL 10 MG/ML IJ SOLN
INTRAMUSCULAR | Status: DC | PRN
  Administered 2021-08-15: 20 mg via INTRAVENOUS

## 2021-08-15 MED ORDER — OXYCODONE HCL 5 MG/5ML PO SOLN: 5.0000 mg | Freq: Once | ORAL | Status: DC | PRN

## 2021-08-15 MED ORDER — OXYCODONE HCL 5 MG PO TABS
10.0000 mg | ORAL_TABLET | ORAL | Status: DC | PRN
  Administered 2021-08-15 – 2021-08-16 (×3): 10 mg via ORAL
  Filled 2021-08-15 (×3): qty 2

## 2021-08-15 MED ORDER — LIDOCAINE 2% (20 MG/ML) 5 ML SYRINGE
INTRAMUSCULAR | Status: DC | PRN
  Administered 2021-08-15: 60 mg via INTRAVENOUS

## 2021-08-15 MED ORDER — ONDANSETRON HCL 4 MG PO TABS: 4.0000 mg | ORAL_TABLET | Freq: Four times a day (QID) | ORAL | Status: DC | PRN

## 2021-08-15 MED ORDER — LACTATED RINGERS IV SOLN: INTRAVENOUS | Status: DC

## 2021-08-15 MED ORDER — VANCOMYCIN HCL 1000 MG IV SOLR: 1000.0000 mg | Freq: Once | INTRAVENOUS | Status: DC

## 2021-08-15 MED ORDER — DEXAMETHASONE SODIUM PHOSPHATE 10 MG/ML IJ SOLN
INTRAMUSCULAR | Status: DC | PRN
  Administered 2021-08-15: 5 mg via INTRAVENOUS

## 2021-08-15 MED ORDER — ONDANSETRON HCL 4 MG/2ML IJ SOLN: 4.0000 mg | Freq: Once | INTRAMUSCULAR | Status: DC | PRN

## 2021-08-15 MED ORDER — TRIAMCINOLONE ACETONIDE 10 MG/ML IJ SUSP
10.0000 mg | Freq: Once | INTRAMUSCULAR | Status: DC
  Filled 2021-08-15: qty 1

## 2021-08-15 MED ORDER — MORPHINE SULFATE (PF) 4 MG/ML IV SOLN: 4.0000 mg | INTRAVENOUS | Status: DC | PRN

## 2021-08-15 MED ORDER — BUPIVACAINE LIPOSOME 1.3 % IJ SUSP
INTRAMUSCULAR | Status: AC
  Filled 2021-08-15: qty 20

## 2021-08-15 MED ORDER — SODIUM CHLORIDE 0.9 % IV SOLN: 250.0000 mL | INTRAVENOUS | Status: DC

## 2021-08-15 MED ORDER — HYDROMORPHONE HCL 1 MG/ML IJ SOLN
INTRAMUSCULAR | Status: AC
  Filled 2021-08-15: qty 1

## 2021-08-15 MED ORDER — ACETAMINOPHEN 650 MG RE SUPP: 650.0000 mg | RECTAL | Status: DC | PRN

## 2021-08-15 MED ORDER — ORAL CARE MOUTH RINSE: 15.0000 mL | Freq: Once | OROMUCOSAL | Status: AC

## 2021-08-15 MED ORDER — FENTANYL CITRATE (PF) 250 MCG/5ML IJ SOLN
INTRAMUSCULAR | Status: DC | PRN
  Administered 2021-08-15 (×3): 50 ug via INTRAVENOUS

## 2021-08-15 MED ORDER — MIDAZOLAM HCL 2 MG/2ML IJ SOLN
INTRAMUSCULAR | Status: AC
  Filled 2021-08-15: qty 2

## 2021-08-15 MED ORDER — BUPIVACAINE LIPOSOME 1.3 % IJ SUSP
INTRAMUSCULAR | Status: DC | PRN
Start: 2021-08-15 — End: 2021-08-15
  Administered 2021-08-15: 20 mL

## 2021-08-15 MED ORDER — ROCURONIUM BROMIDE 10 MG/ML (PF) SYRINGE
PREFILLED_SYRINGE | INTRAVENOUS | Status: AC
  Filled 2021-08-15: qty 20

## 2021-08-15 MED ORDER — PROPOFOL 10 MG/ML IV BOLUS
INTRAVENOUS | Status: DC | PRN
  Administered 2021-08-15: 120 mg via INTRAVENOUS

## 2021-08-15 MED ORDER — SODIUM CHLORIDE 0.9% FLUSH: 3.0000 mL | INTRAVENOUS | Status: DC | PRN

## 2021-08-15 MED ORDER — POTASSIUM CHLORIDE ER 10 MEQ PO TBCR
20.0000 meq | EXTENDED_RELEASE_TABLET | Freq: Every day | ORAL | Status: DC
  Administered 2021-08-15: 20 meq via ORAL
  Filled 2021-08-15 (×2): qty 2

## 2021-08-15 MED ORDER — CYCLOBENZAPRINE HCL 10 MG PO TABS
10.0000 mg | ORAL_TABLET | Freq: Three times a day (TID) | ORAL | Status: DC | PRN
  Administered 2021-08-15: 10 mg via ORAL
  Filled 2021-08-15: qty 1

## 2021-08-15 MED ORDER — AZELASTINE HCL 0.1 % NA SOLN
2.0000 | Freq: Two times a day (BID) | NASAL | Status: DC | PRN
  Filled 2021-08-15: qty 30

## 2021-08-15 MED ORDER — ONDANSETRON 4 MG PO TBDP: 8.0000 mg | ORAL_TABLET | Freq: Three times a day (TID) | ORAL | Status: DC | PRN

## 2021-08-15 MED ORDER — ONDANSETRON HCL 4 MG/2ML IJ SOLN: 4.0000 mg | Freq: Four times a day (QID) | INTRAMUSCULAR | Status: DC | PRN

## 2021-08-15 MED ORDER — ACETAMINOPHEN 325 MG PO TABS: 650.0000 mg | ORAL_TABLET | ORAL | Status: DC | PRN

## 2021-08-15 MED ORDER — BACITRACIN ZINC 500 UNIT/GM EX OINT
TOPICAL_OINTMENT | CUTANEOUS | Status: AC
  Filled 2021-08-15: qty 28.35

## 2021-08-15 MED ORDER — SODIUM CHLORIDE 0.9% FLUSH
3.0000 mL | Freq: Two times a day (BID) | INTRAVENOUS | Status: DC
  Administered 2021-08-15: 3 mL via INTRAVENOUS

## 2021-08-15 MED ORDER — PROPRANOLOL HCL 40 MG PO TABS
40.0000 mg | ORAL_TABLET | Freq: Two times a day (BID) | ORAL | Status: DC
  Administered 2021-08-15: 40 mg via ORAL
  Filled 2021-08-15 (×2): qty 1

## 2021-08-15 MED ORDER — ALPRAZOLAM 0.5 MG PO TABS
0.5000 mg | ORAL_TABLET | Freq: Every day | ORAL | Status: DC
  Administered 2021-08-15: 0.5 mg via ORAL
  Filled 2021-08-15: qty 1

## 2021-08-15 MED ORDER — THROMBIN 5000 UNITS EX SOLR: CUTANEOUS | Status: DC | PRN

## 2021-08-15 SURGICAL SUPPLY — 55 items
BAG COUNTER SPONGE SURGICOUNT (BAG) ×2 IMPLANT
BASKET BONE COLLECTION (BASKET) ×2 IMPLANT
BENZOIN TINCTURE PRP APPL 2/3 (GAUZE/BANDAGES/DRESSINGS) ×2 IMPLANT
BUR MATCHSTICK NEURO 3.0 LAGG (BURR) ×2 IMPLANT
BUR PRECISION FLUTE 6.0 (BURR) ×2 IMPLANT
CAGE ALTERA 10X31X9-13 15D (Cage) ×1 IMPLANT
CANISTER SUCT 3000ML PPV (MISCELLANEOUS) ×2 IMPLANT
CAP LOCK DLX THRD (Cap) ×4 IMPLANT
CARTRIDGE OIL MAESTRO DRILL (MISCELLANEOUS) ×1 IMPLANT
CNTNR URN SCR LID CUP LEK RST (MISCELLANEOUS) ×1 IMPLANT
CONT SPEC 4OZ STRL OR WHT (MISCELLANEOUS) ×1
COVER BACK TABLE 60X90IN (DRAPES) ×2 IMPLANT
DIFFUSER DRILL AIR PNEUMATIC (MISCELLANEOUS) ×2 IMPLANT
DRAPE C-ARM 42X72 X-RAY (DRAPES) ×4 IMPLANT
DRAPE HALF SHEET 40X57 (DRAPES) ×2 IMPLANT
DRAPE LAPAROTOMY 100X72X124 (DRAPES) ×2 IMPLANT
DRSG OPSITE POSTOP 4X6 (GAUZE/BANDAGES/DRESSINGS) ×2 IMPLANT
ELECT BLADE 4.0 EZ CLEAN MEGAD (MISCELLANEOUS) ×2
ELECT REM PT RETURN 9FT ADLT (ELECTROSURGICAL) ×2
ELECTRODE BLDE 4.0 EZ CLN MEGD (MISCELLANEOUS) ×1 IMPLANT
ELECTRODE REM PT RTRN 9FT ADLT (ELECTROSURGICAL) ×1 IMPLANT
EVACUATOR 1/8 PVC DRAIN (DRAIN) IMPLANT
GAUZE 4X4 16PLY ~~LOC~~+RFID DBL (SPONGE) ×2 IMPLANT
GLOVE SURG ENC MOIS LTX SZ8 (GLOVE) ×4 IMPLANT
GLOVE SURG ENC MOIS LTX SZ8.5 (GLOVE) ×4 IMPLANT
GOWN STRL REUS W/ TWL LRG LVL3 (GOWN DISPOSABLE) IMPLANT
GOWN STRL REUS W/ TWL XL LVL3 (GOWN DISPOSABLE) ×2 IMPLANT
GOWN STRL REUS W/TWL 2XL LVL3 (GOWN DISPOSABLE) IMPLANT
GOWN STRL REUS W/TWL LRG LVL3 (GOWN DISPOSABLE) ×1
GOWN STRL REUS W/TWL XL LVL3 (GOWN DISPOSABLE) ×4
HEMOSTAT POWDER KIT SURGIFOAM (HEMOSTASIS) ×3 IMPLANT
KIT BASIN OR (CUSTOM PROCEDURE TRAY) ×2 IMPLANT
KIT GRAFTMAG DEL NEURO DISP (NEUROSURGERY SUPPLIES) ×1 IMPLANT
KIT TURNOVER KIT B (KITS) ×2 IMPLANT
NDL HYPO 21X1.5 SAFETY (NEEDLE) IMPLANT
NEEDLE HYPO 21X1.5 SAFETY (NEEDLE) ×2 IMPLANT
NEEDLE HYPO 22GX1.5 SAFETY (NEEDLE) ×2 IMPLANT
NS IRRIG 1000ML POUR BTL (IV SOLUTION) ×2 IMPLANT
OIL CARTRIDGE MAESTRO DRILL (MISCELLANEOUS) ×2
PACK LAMINECTOMY NEURO (CUSTOM PROCEDURE TRAY) ×2 IMPLANT
PUTTY DBM 10CC CALC GRAN (Putty) ×1 IMPLANT
ROD CREO DLX CVD 6.35X40 (Rod) IMPLANT
ROD CURVED TI 6.35X40 (Rod) ×2 IMPLANT
SCREW PA DLX CREO 7.5X50 (Screw) ×4 IMPLANT
SPONGE SURGIFOAM ABS GEL 100 (HEMOSTASIS) ×1 IMPLANT
SPONGE T-LAP 4X18 ~~LOC~~+RFID (SPONGE) ×2 IMPLANT
STRIP CLOSURE SKIN 1/2X4 (GAUZE/BANDAGES/DRESSINGS) ×2 IMPLANT
SUT VIC AB 1 CT1 18XBRD ANBCTR (SUTURE) ×2 IMPLANT
SUT VIC AB 1 CT1 8-18 (SUTURE) ×1
SUT VIC AB 2-0 CP2 18 (SUTURE) ×3 IMPLANT
SYR 20ML LL LF (SYRINGE) ×1 IMPLANT
TOWEL GREEN STERILE (TOWEL DISPOSABLE) ×2 IMPLANT
TOWEL GREEN STERILE FF (TOWEL DISPOSABLE) ×2 IMPLANT
TRAY FOLEY MTR SLVR 16FR STAT (SET/KITS/TRAYS/PACK) ×2 IMPLANT
WATER STERILE IRR 1000ML POUR (IV SOLUTION) ×2 IMPLANT

## 2021-08-15 NOTE — Op Note (Signed)
Brief history: The patient is a 76 year old white female who is complained of back and left leg pain consistent with a lumbosacral radiculopathy.  She failed medical management and worked up with lumbar x-rays and lumbar MRI which demonstrated a degenerative scoliosis and an L5-S1 spondylolisthesis with severe foraminal stenosis.  I discussed the various treatment options.  She has decided proceed with surgery. ? ?Preoperative diagnosis: Adult degenerative scoliosis, lumbosacral spine listhesis, degenerative disc disease, lumbosacral foraminal stenosis and lumbar spinal stenosis stenosis compressing both the L5 and the S1 nerve roots; lumbago; lumbar radiculopathy; neurogenic claudication ? ?Postoperative diagnosis: The same ? ?Procedure: Bilateral L5-S1 laminotomy/foraminotomies/medial facetectomy to decompress the bilateral L5 and S1 nerve roots(the work required to do this was in addition to the work required to do the posterior lumbar interbody fusion because of the patient's spinal stenosis, facet arthropathy. Etc. requiring a wide decompression of the nerve roots.);  Left L5-S1 transforaminal lumbar interbody fusion with local morselized autograft bone and Zimmer DBM; insertion of interbody prosthesis at L5-S1 (globus peek expandable interbody prosthesis); posterior nonsegmental instrumentation from L5 to S1 with globus titanium pedicle screws and rods; posterior lateral arthrodesis at L5-S1 with local morselized autograft bone and Zimmer DBM. ? ?Surgeon: Dr. Earle Gell ? ?Asst.: Arnetha Massy, NP ? ?Anesthesia: Gen. endotracheal ? ?Estimated blood loss: 200 cc ? ?Drains: None ? ?Complications: None ? ?Description of procedure: The patient was brought to the operating room by the anesthesia team. General endotracheal anesthesia was induced. The patient was turned to the prone position on the Wilson frame. The patient's lumbosacral region was then prepared with Betadine scrub and Betadine solution. Sterile  drapes were applied. ? ?I then injected the area to be incised with Marcaine with epinephrine solution. I then used the scalpel to make a linear midline incision over the L5-S1 interspace. I then used electrocautery to perform a bilateral subperiosteal dissection exposing the spinous process and lamina of L5-S1. We then obtained intraoperative radiograph to confirm our location. We then inserted the Verstrac retractor to provide exposure. ? ?I began the decompression by using the high speed drill to perform laminotomies at L5-S1 bilaterally. We then used the Kerrison punches to widen the laminotomy and removed the ligamentum flavum at L5-S1 bilaterally. We used the Kerrison punches to remove the medial facets at L5-S1 bilaterally, we removed the left elbow 5 S1 facet. We performed wide foraminotomies about the bilateral L5 and S1 nerve roots completing the decompression. ? ?We now turned our attention to the posterior lumbar interbody fusion. I used a scalpel to incise the intervertebral disc at L5-S1 bilaterally. I then performed a partial intervertebral discectomy at L5-S1 bilaterally using the pituitary forceps. We prepared the vertebral endplates at B5-Z0 bilaterally for the fusion by removing the soft tissues with the curettes. We then used the trial spacers to pick the appropriate sized interbody prosthesis. We prefilled his prosthesis with a combination of local morselized autograft bone that we obtained during the decompression as well as Zimmer DBM. We inserted the prefilled prosthesis into the interspace at L5-S1 from the left, we then turned and expanded the prosthesis. There was a good snug fit of the prosthesis in the interspace. We then filled and the remainder of the intervertebral disc space with local morselized autograft bone and Zimmer DBM. This completed the posterior lumbar interbody arthrodesis.  During the decompression and insertion of the prosthesis the assistant protected the thecal sac and  nerve roots with the D'Errico retractor. ? ?We now turned attention to  the instrumentation. Under fluoroscopic guidance we cannulated the bilateral L5 and S1 pedicles with the bone probe. We then removed the bone probe. We then tapped the pedicle with a 6.5 millimeter tap. We then removed the tap. We probed inside the tapped pedicle with a ball probe to rule out cortical breaches. We then inserted a 7.5 x 50 millimeter pedicle screw into the L5 and S1 pedicles bilaterally under fluoroscopic guidance. We then palpated along the medial aspect of the pedicles to rule out cortical breaches. There were none. The nerve roots were not injured. We then connected the unilateral pedicle screws with a lordotic rod. We compressed the construct and secured the rod in place with the caps. We then tightened the caps appropriately. This completed the instrumentation from L5-S1 bilaterally. ? ?We now turned our attention to the posterior lateral arthrodesis at L5-S1. We used the high-speed drill to decorticate the remainder of the facets, pars, transverse process at L5-S1. We then applied a combination of local morselized autograft bone and Zimmer DBM over these decorticated posterior lateral structures. This completed the posterior lateral arthrodesis. ? ?We then obtained hemostasis using bipolar electrocautery. We irrigated the wound out with bacitracin solution. We inspected the thecal sac and nerve roots and noted they were well decompressed. We then removed the retractor.  We injected Exparel . We reapproximated patient's thoracolumbar fascia with interrupted #1 Vicryl suture. We reapproximated patient's subcutaneous tissue with interrupted 2-0 Vicryl suture. The reapproximated patient's skin with Steri-Strips and benzoin. The wound was then coated with bacitracin ointment. A sterile dressing was applied. The drapes were removed. The patient was subsequently returned to the supine position where they were extubated by the  anesthesia team. He was then transported to the post anesthesia care unit in stable condition. All sponge instrument and needle counts were reportedly correct at the end of this case. ? ? ? ? ? ?

## 2021-08-15 NOTE — Anesthesia Preprocedure Evaluation (Addendum)
Anesthesia Evaluation  ?Patient identified by MRN, date of birth, ID band ?Patient awake ? ? ? ?Reviewed: ?Allergy & Precautions, H&P , NPO status , Patient's Chart, lab work & pertinent test results ? ?Airway ?Mallampati: II ? ? ?Neck ROM: full ? ? ? Dental ?  ?Pulmonary ?neg pulmonary ROS,  ?  ?breath sounds clear to auscultation ? ? ? ? ? ? Cardiovascular ?hypertension, Pt. on medications ? ?Rhythm:regular Rate:Normal ? ? ?  ?Neuro/Psych ? Headaches, negative psych ROS  ? GI/Hepatic ?Neg liver ROS, GERD  Medicated and Controlled,  ?Endo/Other  ?negative endocrine ROS ? Renal/GU ?negative Renal ROS  ?negative genitourinary ?  ?Musculoskeletal ? ?(+) Arthritis , Osteoarthritis,  Lumbosacral spondylolisthesis   ? Abdominal ?  ?Peds ? Hematology ?negative hematology ROS ?(+) hct 40.4   ?Anesthesia Other Findings ? ? Reproductive/Obstetrics ?negative OB ROS ? ?  ? ? ? ? ? ? ? ? ? ? ? ? ? ?  ?  ? ? ? ? ? ? ? ?Anesthesia Physical ?Anesthesia Plan ? ?ASA: 2 ? ?Anesthesia Plan: General  ? ?Post-op Pain Management: Tylenol PO (pre-op)*  ? ?Induction: Intravenous ? ?PONV Risk Score and Plan: 3 and Ondansetron, Dexamethasone and Treatment may vary due to age or medical condition ? ?Airway Management Planned: Oral ETT ? ?Additional Equipment: None ? ?Intra-op Plan:  ? ?Post-operative Plan: Extubation in OR ? ?Informed Consent: I have reviewed the patients History and Physical, chart, labs and discussed the procedure including the risks, benefits and alternatives for the proposed anesthesia with the patient or authorized representative who has indicated his/her understanding and acceptance.  ? ? ? ?Dental advisory given ? ?Plan Discussed with: CRNA, Anesthesiologist and Surgeon ? ?Anesthesia Plan Comments:   ? ? ? ? ? ?Anesthesia Quick Evaluation ? ?

## 2021-08-15 NOTE — Transfer of Care (Signed)
Immediate Anesthesia Transfer of Care Note ? ?Patient: Judy Watts ? ?Procedure(s) Performed: LUMBAR FIVE-SACRAL ONE POSTERIOR LUMBAR INTERBODY FUSION (Spine Lumbar) ? ?Patient Location: PACU ? ?Anesthesia Type:General ? ?Level of Consciousness: awake and drowsy ? ?Airway & Oxygen Therapy: Patient Spontanous Breathing and Patient connected to face mask oxygen ? ?Post-op Assessment: Report given to RN and Post -op Vital signs reviewed and stable ? ?Post vital signs: Reviewed and stable ? ?Last Vitals:  ?Vitals Value Taken Time  ?BP    ?Temp    ?Pulse    ?Resp    ?SpO2    ? ? ?Last Pain:  ?Vitals:  ? 08/15/21 1011  ?TempSrc:   ?PainSc: 9   ?   ? ?Patients Stated Pain Goal: 3 (08/15/21 1011) ? ?Complications: No notable events documented. ?

## 2021-08-15 NOTE — Anesthesia Procedure Notes (Signed)
Procedure Name: Intubation ?Date/Time: 08/15/2021 12:02 PM ?Performed by: Glynda Jaeger, CRNA ?Pre-anesthesia Checklist: Patient identified, Patient being monitored, Timeout performed, Emergency Drugs available and Suction available ?Patient Re-evaluated:Patient Re-evaluated prior to induction ?Oxygen Delivery Method: Circle System Utilized ?Preoxygenation: Pre-oxygenation with 100% oxygen ?Induction Type: IV induction ?Ventilation: Mask ventilation without difficulty ?Laryngoscope Size: Mac and 4 ?Grade View: Grade I ?Tube type: Oral ?Tube size: 7.5 mm ?Number of attempts: 1 ?Airway Equipment and Method: Stylet ?Placement Confirmation: ETT inserted through vocal cords under direct vision, positive ETCO2 and breath sounds checked- equal and bilateral ?Secured at: 22 cm ?Tube secured with: Tape ?Dental Injury: Teeth and Oropharynx as per pre-operative assessment  ? ? ? ? ?

## 2021-08-15 NOTE — Progress Notes (Signed)
Orthopedic Tech Progress Note ?Patient Details:  ?Judy Watts ?[birth year] ?[id number] ? ?Ortho Devices ?Type of Ortho Device: Lumbar corsett ?Ortho Device/Splint Interventions: Ordered ?  ?  ?Dropped brace off with 3C front desk because pt is still in PACU. ?Vernona Rieger ?08/15/2021, 4:30 PM ? ?
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
## 2021-08-16 DIAGNOSIS — Z79899 Other long term (current) drug therapy: Secondary | ICD-10-CM | POA: Diagnosis not present

## 2021-08-16 DIAGNOSIS — M48062 Spinal stenosis, lumbar region with neurogenic claudication: Secondary | ICD-10-CM | POA: Diagnosis not present

## 2021-08-16 DIAGNOSIS — K219 Gastro-esophageal reflux disease without esophagitis: Secondary | ICD-10-CM | POA: Diagnosis not present

## 2021-08-16 DIAGNOSIS — R519 Headache, unspecified: Secondary | ICD-10-CM | POA: Diagnosis not present

## 2021-08-16 DIAGNOSIS — M5117 Intervertebral disc disorders with radiculopathy, lumbosacral region: Secondary | ICD-10-CM | POA: Diagnosis not present

## 2021-08-16 DIAGNOSIS — M4317 Spondylolisthesis, lumbosacral region: Secondary | ICD-10-CM | POA: Diagnosis not present

## 2021-08-16 DIAGNOSIS — M4157 Other secondary scoliosis, lumbosacral region: Secondary | ICD-10-CM | POA: Diagnosis not present

## 2021-08-16 DIAGNOSIS — I10 Essential (primary) hypertension: Secondary | ICD-10-CM | POA: Diagnosis not present

## 2021-08-16 LAB — BASIC METABOLIC PANEL
Anion gap: 11 (ref 5–15)
BUN: 10 mg/dL (ref 8–23)
CO2: 23 mmol/L (ref 22–32)
Calcium: 8.8 mg/dL — ABNORMAL LOW (ref 8.9–10.3)
Chloride: 107 mmol/L (ref 98–111)
Creatinine, Ser: 0.89 mg/dL (ref 0.44–1.00)
GFR, Estimated: >60 mL/min (ref >60)
Glucose, Bld: 162 mg/dL — ABNORMAL HIGH (ref 70–99)
Potassium: 3.9 mmol/L (ref 3.5–5.1)
Sodium: 141 mmol/L (ref 135–145)

## 2021-08-16 LAB — CBC
HCT: 32.4 % — ABNORMAL LOW (ref 36.0–46.0)
Hemoglobin: 11.1 g/dL — ABNORMAL LOW (ref 12.0–15.0)
MCH: 30.8 pg (ref 26.0–34.0)
MCHC: 34.3 g/dL (ref 30.0–36.0)
MCV: 90 fL (ref 80.0–100.0)
Platelets: 212 10*3/uL (ref 150–400)
RBC: 3.6 MIL/uL — ABNORMAL LOW (ref 3.87–5.11)
RDW: 13.2 % (ref 11.5–15.5)
WBC: 11.2 10*3/uL — ABNORMAL HIGH (ref 4.0–10.5)
nRBC: 0 % (ref 0.0–0.2)

## 2021-08-16 MED ORDER — DOCUSATE SODIUM 100 MG PO CAPS: 100.0000 mg | ORAL_CAPSULE | Freq: Two times a day (BID) | ORAL | 0 refills | Status: DC

## 2021-08-16 MED ORDER — CYCLOBENZAPRINE HCL 5 MG PO TABS: 5.0000 mg | ORAL_TABLET | Freq: Three times a day (TID) | ORAL | Status: DC | PRN

## 2021-08-16 MED ORDER — CYCLOBENZAPRINE HCL 5 MG PO TABS: 5.0000 mg | ORAL_TABLET | Freq: Three times a day (TID) | ORAL | 0 refills | Status: AC | PRN

## 2021-08-16 MED ORDER — OXYCODONE-ACETAMINOPHEN 5-325 MG PO TABS: 1.0000 | ORAL_TABLET | ORAL | 0 refills | Status: DC | PRN

## 2021-08-16 MED ORDER — OXYCODONE-ACETAMINOPHEN 5-325 MG PO TABS
1.0000 | ORAL_TABLET | ORAL | Status: DC | PRN
  Administered 2021-08-16: 2 via ORAL
  Filled 2021-08-16: qty 2

## 2021-08-16 MED FILL — Thrombin For Soln 5000 Unit: CUTANEOUS | Qty: 5000 | Status: AC

## 2021-08-16 NOTE — Evaluation (Signed)
Physical Therapy Evaluation & Discharge ?Patient Details ?Name: Judy Watts ?MRN: [id number] ?DOB: 27-Dec-1945 ?Today's Date: 08/16/2021 ? ?History of Present Illness ? 76 y/o female admitted on 08/15/21 following L5-S1 decompression, instrumentation, and fusion. PMH: HTN  ?Clinical Impression ? Patient admitted following above procedure. Patient functioning at modI level for mobility with no AD. Patient able to negotiate flight of stairs in preparation to return home. Educated patient on back precautions, brace wear, and progressive walking program, patient verbalized understanding. No further skilled PT needs required acutely. No PT follow up recommended at this time.  ?   ?   ? ?Recommendations for follow up therapy are one component of a multi-disciplinary discharge planning process, led by the attending physician.  Recommendations may be updated based on patient status, additional functional criteria and insurance authorization. ? ?Follow Up Recommendations No PT follow up ? ?  ?Assistance Recommended at Discharge PRN  ?Patient can return home with the following ?   ? ?  ?Equipment Recommendations None recommended by PT  ?Recommendations for Other Services ?    ?  ?Functional Status Assessment Patient has had a recent decline in their functional status and demonstrates the ability to make significant improvements in function in a reasonable and predictable amount of time.  ? ?  ?Precautions / Restrictions Precautions ?Precautions: Back ?Precaution Booklet Issued: Yes (comment) ?Required Braces or Orthoses: Spinal Brace ?Spinal Brace: Lumbar corset;Applied in sitting position ?Restrictions ?Weight Bearing Restrictions: No  ? ?  ? ?Mobility ? Bed Mobility ?Overal bed mobility: Modified Independent ?  ?  ?  ?  ?  ?  ?  ?  ? ?Transfers ?Overall transfer level: Modified independent ?Equipment used: None ?  ?  ?  ?  ?  ?  ?  ?  ?  ? ?Ambulation/Gait ?Ambulation/Gait assistance: Modified independent (Device/Increase  time) ?Gait Distance (Feet): 400 Feet ?Assistive device: None ?Gait Pattern/deviations: WFL(Within Functional Limits) ?Gait velocity: decreased ?  ?  ?  ? ?Stairs ?Stairs: Yes ?Stairs assistance: Modified independent (Device/Increase time) ?Stair Management: One rail Right, Alternating pattern, Forwards ?Number of Stairs: 10 ?  ? ?Wheelchair Mobility ?  ? ?Modified Rankin (Stroke Patients Only) ?  ? ?  ? ?Balance Overall balance assessment: Mild deficits observed, not formally tested ?  ?  ?  ?  ?  ?  ?  ?  ?  ?  ?  ?  ?  ?  ?  ?  ?  ?  ?   ? ? ? ?Pertinent Vitals/Pain Pain Assessment ?Pain Assessment: No/denies pain  ? ? ?Home Living Family/patient expects to be discharged to:: Private residence ?Living Arrangements: Spouse/significant other ?Available Help at Discharge: Family;Available 24 hours/day ?Type of Home: House ?Home Access: Stairs to enter ?Entrance Stairs-Rails: Right;Left;Can reach both ?Entrance Stairs-Number of Steps: 9 ?  ?Home Layout: Two level;Able to live on main level with bedroom/bathroom ?  ?   ?  ?Prior Function Prior Level of Function : Independent/Modified Independent;Working/employed;Driving ?  ?  ?  ?  ?  ?  ?  ?  ?  ? ? ?Hand Dominance  ?   ? ?  ?Extremity/Trunk Assessment  ? Upper Extremity Assessment ?Upper Extremity Assessment: Defer to OT evaluation ?  ? ?Lower Extremity Assessment ?Lower Extremity Assessment: Overall WFL for tasks assessed ?  ? ?Cervical / Trunk Assessment ?Cervical / Trunk Assessment: Back Surgery  ?Communication  ? Communication: No difficulties  ?Cognition Arousal/Alertness: Awake/alert ?Behavior During Therapy: [facility] for tasks  assessed/performed ?Overall Cognitive Status: Within Functional Limits for tasks assessed ?  ?  ?  ?  ?  ?  ?  ?  ?  ?  ?  ?  ?  ?  ?  ?  ?  ?  ?  ? ?  ?General Comments   ? ?  ?Exercises    ? ?Assessment/Plan  ?  ?PT Assessment Patient does not need any further PT services  ?PT Problem List   ? ?   ?  ?PT Treatment Interventions     ? ?PT  Goals (Current goals can be found in the Care Plan section)  ?Acute Rehab PT Goals ?Patient Stated Goal: to go home ?PT Goal Formulation: All assessment and education complete, DC therapy ? ?  ?Frequency   ?  ? ? ?Co-evaluation   ?  ?  ?  ?  ? ? ?  ?AM-PAC PT "6 Clicks" Mobility  ?Outcome Measure Help needed turning from your back to your side while in a flat bed without using bedrails?: None ?Help needed moving from lying on your back to sitting on the side of a flat bed without using bedrails?: None ?Help needed moving to and from a bed to a chair (including a wheelchair)?: None ?Help needed standing up from a chair using your arms (e.g., wheelchair or bedside chair)?: None ?Help needed to walk in hospital room?: None ?Help needed climbing 3-5 steps with a railing? : None ?6 Click Score: 24 ? ?  ?End of Session Equipment Utilized During Treatment: Back brace ?Activity Tolerance: Patient tolerated treatment well ?Patient left: Other (comment) (handoff to OT) ?Nurse Communication: Mobility status ?PT Visit Diagnosis: Muscle weakness (generalized) (M62.81) ?  ? ?Time: 4782-9562 ?PT Time Calculation (min) (ACUTE ONLY): 20 min ? ? ?Charges:   PT Evaluation ?$PT Eval Low Complexity: 1 Low ?  ?  ?   ? ? ?Avyonna Wagoner A. Gilford Rile, PT, DPT ?Acute Rehabilitation Services ?Pager [phone] ?Office [phone] ? ? ?Lindalee Huizinga A Gavina Dildine ?08/16/2021, 8:22 AM ? ?

## 2021-08-16 NOTE — Anesthesia Postprocedure Evaluation (Signed)
Anesthesia Post Note ? ?Patient: Judy Watts ? ?Procedure(s) Performed: LUMBAR FIVE-SACRAL ONE POSTERIOR LUMBAR INTERBODY FUSION (Spine Lumbar) ? ?  ? ?Patient location during evaluation: PACU ?Anesthesia Type: General ?Level of consciousness: awake and alert ?Pain management: pain level controlled ?Vital Signs Assessment: post-procedure vital signs reviewed and stable ?Respiratory status: spontaneous breathing, nonlabored ventilation, respiratory function stable and patient connected to nasal cannula oxygen ?Cardiovascular status: blood pressure returned to baseline and stable ?Postop Assessment: no apparent nausea or vomiting ?Anesthetic complications: no ? ? ?No notable events documented. ? ?Last Vitals:  ?Vitals:  ? 08/16/21 0407 08/16/21 0819  ?BP: 96/68 111/73  ?Pulse: 75 87  ?Resp: 18 18  ?Temp: 36.7 ?C 36.7 ?C  ?SpO2: 96% 100%  ?  ?Last Pain:  ?Vitals:  ? 08/16/21 0819  ?TempSrc: Oral  ?PainSc:   ? ? ?  ?  ?  ?  ?  ?  ? ?[place] ? ? ? ? ?

## 2021-08-16 NOTE — Progress Notes (Signed)
Pt doing well. Pt given D/C instructions with verbal understanding. Rx's were sent to the pharmacy by MD. Pt's IV was removed prior to D/C. Pt D/C'd home via wheelchair per MD order. Pt is stable @ D/C and has no other needs at this time. Holli Humbles, RN  ?

## 2021-08-16 NOTE — Discharge Summary (Signed)
Physician Discharge Summary  ?Patient ID: ?Goshen ?MRN: [id number] ?DOB/AGE: [birth year] 76 y.o. ? ?Admit date: 08/15/2021 ?Discharge date: 08/16/2021 ? ?Admission Diagnoses: Adult degenerative scoliosis, lumbosacral spondylolisthesis, lumbosacral foraminal stenosis, lumbosacral radiculopathy, neurogenic claudication, lumbago ? ?Discharge Diagnoses: The same ?Principal Problem: ?  Spondylolisthesis of lumbosacral region ? ? ?Discharged Condition: good ? ?Hospital Course: I performed an L5-S1 decompression, instrumentation and fusion on 08/15/2021.  The surgery went well. ? ?The patient's postoperative course was unremarkable.  On postoperative day #1 she requested discharge to home.  The patient, and her husband, were given verbal and written discharge instructions.  All their questions were answered. ? ?Consults: PT, OT, care management ?Significant Diagnostic Studies: None ?Treatments: L5-S1 decompression, instrumentation and fusion. ?Discharge Exam: ?Blood pressure 96/68, pulse 75, temperature 98 ?F (36.7 ?C), temperature source Oral, resp. rate 18, height 5\' 4"  (1.626 m), weight 60.8 kg, SpO2 96 %. ?The patient is alert and pleasant.  She is in no apparent distress.  She looks well.  Her strength is normal. ? ?Disposition: Home ? ?Discharge Instructions   ? ? Call MD for:  difficulty breathing, headache or visual disturbances   Complete by: As directed ?  ? Call MD for:  extreme fatigue   Complete by: As directed ?  ? Call MD for:  hives   Complete by: As directed ?  ? Call MD for:  persistant dizziness or light-headedness   Complete by: As directed ?  ? Call MD for:  persistant nausea and vomiting   Complete by: As directed ?  ? Call MD for:  redness, tenderness, or signs of infection (pain, swelling, redness, odor or green/yellow discharge around incision site)   Complete by: As directed ?  ? Call MD for:  severe uncontrolled pain   Complete by: As directed ?  ? Call MD for:  temperature >100.4   Complete by: As  directed ?  ? Diet - low sodium heart healthy   Complete by: As directed ?  ? Discharge instructions   Complete by: As directed ?  ? Call [phone] for a followup appointment. Take a stool softener while you are using pain medications.  ? Driving Restrictions   Complete by: As directed ?  ? Do not drive for 2 weeks.  ? Increase activity slowly   Complete by: As directed ?  ? Lifting restrictions   Complete by: As directed ?  ? Do not lift more than 5 pounds. No excessive bending or twisting.  ? May shower / Bathe   Complete by: As directed ?  ? Remove the dressing for 3 days after surgery.  You may shower, but leave the incision alone.  ? Remove dressing in 48 hours   Complete by: As directed ?  ? ?  ? ?Allergies as of 08/16/2021   ?No Known Allergies ?  ? ?  ?Medication List  ?  ? ?STOP taking these medications   ? ?diclofenac 75 MG EC tablet ?Commonly known as: VOLTAREN ?  ?HYDROcodone-acetaminophen 7.5-325 MG tablet ?Commonly known as: NORCO ?  ?meloxicam 15 MG tablet ?Commonly known as: MOBIC ?  ? ?  ? ?TAKE these medications   ? ?ALPRAZolam 0.5 MG tablet ?Commonly known as: Duanne Moron ?Take 0.5 mg by mouth at bedtime. ?  ?aspirin 81 MG tablet ?Take 81 mg by mouth daily. ?  ?azelastine 0.1 % nasal spray ?Commonly known as: ASTELIN ?Place 2 sprays into both nostrils 2 (two) times daily as needed for rhinitis or  allergies. ?  ?cyclobenzaprine 5 MG tablet ?Commonly known as: FLEXERIL ?Take 1 tablet (5 mg total) by mouth 3 (three) times daily as needed for muscle spasms. ?  ?denosumab 60 MG/ML Sosy injection ?Commonly known as: PROLIA ?Inject 60 mg into the skin every 6 (six) months. ?  ?dicyclomine 10 MG capsule ?Commonly known as: BENTYL ?Take 10 mg by mouth 3 (three) times daily. ?  ?docusate sodium 100 MG capsule ?Commonly known as: COLACE ?Take 1 capsule (100 mg total) by mouth 2 (two) times daily. ?  ?fish oil-omega-3 fatty acids 1000 MG capsule ?Take 1 g by mouth 3 (three) times daily. ?  ?gabapentin 300 MG  capsule ?Commonly known as: NEURONTIN ?Take 300 mg by mouth 4 (four) times daily. ?  ?hydrochlorothiazide 25 MG tablet ?Commonly known as: HYDRODIURIL ?Take 25 mg by mouth daily. ?  ?montelukast 10 MG tablet ?Commonly known as: SINGULAIR ?Take 10 mg by mouth at bedtime. ?  ?multivitamin tablet ?Take 1 tablet by mouth daily. ?  ?omeprazole 40 MG capsule ?Commonly known as: PRILOSEC ?Take 40 mg by mouth daily. ?  ?ondansetron 8 MG disintegrating tablet ?Commonly known as: ZOFRAN-ODT ?Take 8 mg by mouth every 8 (eight) hours as needed (migraines). ?  ?oxyCODONE-acetaminophen 5-325 MG tablet ?Commonly known as: PERCOCET/ROXICET ?Take 1-2 tablets by mouth every 4 (four) hours as needed for moderate pain. ?  ?Potassium Chloride ER 20 MEQ Tbcr ?Take 1 tablet by mouth daily. ?  ?propranolol 80 MG tablet ?Commonly known as: INDERAL ?Take 0.5 tablets (40 mg total) by mouth 2 (two) times daily. ?What changed: how much to take ?  ?topiramate 25 MG tablet ?Commonly known as: TOPAMAX ?Take 25 mg by mouth 3 (three) times daily. ?  ?vitamin C 1000 MG tablet ?Take 1,000 mg by mouth daily. ?  ? ?  ? ? ? ?Signed: ?Ophelia Charter ?08/16/2021, 7:47 AM ? ? ? ? ?

## 2021-08-16 NOTE — Evaluation (Signed)
Occupational Therapy Evaluation ?Patient Details ?Name: Judy Watts ?MRN: [id number] ?DOB: Apr 14, 1946 ?Today's Date: 08/16/2021 ? ? ?History of Present Illness 76 y/o female admitted on 08/15/21 following L5-S1 decompression, instrumentation, and fusion. PMH: HTN  ? ?Clinical Impression ?  ?Pt admitted as above s/p back surgery on 08/15/21. Pt is overall mod I for functional mobility, min guard-min A for LB ADL's and simulated tub transfer while maintaining back precautions. Handout issued and reviewed with both pt and spouse whom is able to provide necessary assist at d/c. Pt has shower seat in tub at home and completed simulated tub transfer w/ overall min guard-min assist for safety. Pt plans to d/c home later today, has necessary PRN assist and denies any further needs at this time. Will sign off acute OT. ?   ? ?Recommendations for follow up therapy are one component of a multi-disciplinary discharge planning process, led by the attending physician.  Recommendations may be updated based on patient status, additional functional criteria and insurance authorization.  ? ?Follow Up Recommendations ? No OT follow up  ?  ?Assistance Recommended at Discharge Intermittent Supervision/Assistance  ?Patient can return home with the following A little help with bathing/dressing/bathroom;Assistance with cooking/housework;Assist for transportation ? ?  ?Functional Status Assessment ? Patient has had a recent decline in their functional status and demonstrates the ability to make significant improvements in function in a reasonable and predictable amount of time.  ?Equipment Recommendations ? None recommended by OT  ?  ?Recommendations for Other Services   ? ? ?  ?Precautions / Restrictions Precautions ?Precautions: Back ?Precaution Booklet Issued: Yes (comment) ?Precaution Comments: Handout issued and reviewed w/ pt and her husband ?Required Braces or Orthoses: Spinal Brace ?Spinal Brace: Lumbar corset;Applied in sitting  position ?Restrictions ?Weight Bearing Restrictions: No  ? ?  ? ?Mobility Bed Mobility ? Pt received in hallway following PT. ? ?Transfers ?Overall transfer level: Modified independent ?Equipment used: None ?  ?  ? ?  ?Balance Overall balance assessment: Mild deficits observed, not formally tested ?  ?   ? ?ADL either performed or assessed with clinical judgement  ? ?ADL Overall ADL's : Needs assistance/impaired ?Eating/Feeding: Set up;Independent ?  ?Grooming: Set up;Supervision/safety ?  ?Upper Body Bathing: Supervision/ safety;Set up;Sitting;Standing ?  ?Lower Body Bathing: Minimal assistance;Sit to/from stand;With caregiver independent assisting ?Lower Body Bathing Details (indicate cue type and reason): Simulated: Pt and pt husband educated in bathing and dressing techniques for LB while maintaining back precautions. Pt spouse independent w/ assisting ?Upper Body Dressing : Supervision/safety;Sitting;Set up;Min guard;Adhering to UE precautions ?  ?Lower Body Dressing: Minimal assistance;Sit to/from stand;With caregiver independent assisting ?Lower Body Dressing Details (indicate cue type and reason): Simulated: Pt ab;e top reach socks in sitting at EOB while maintaining back precautions. Pt and pt husband educated in bathing and dressing techniques for LB while maintaining back precautions. Pt spouse independent w/ assisting ?Toilet Transfer: Modified Independent;Ambulation;Regular Toilet ?Toilet Transfer Details (indicate cue type and reason): Pt maintainined back precautions during toilet transfer. Pt educated verbally to turin body to flush toilet to avoid twisting. ?Toileting- Clothing Manipulation and Hygiene: Modified independent;Sitting/lateral lean;Sit to/from stand ?  ?Tub/ Shower Transfer: Tub transfer;Min guard;Supervision/safety;With caregiver independent assisting;Ambulation ?Tub/Shower Transfer Details (indicate cue type and reason): Pt simulated tub transfer in room by side stepping and lifting  knees. Husband present and able to assist PRN at d/c pr report ?Functional mobility during ADLs: Modified independent;Supervision/safety ?General ADL Comments: Pt is overall mod I - min guard assist for functional  mobility, LB ADL's and transfers while maintaining back precautions. Handout issued and reviewed with both pt and spouse whom is able to provide necessary assist at d/c. Pt has shower seat in tub at home and completed simulated tub transfer w/ overall min guard-min assist for safety. Pt plans to d/c home later today, has necessary PRN assist and denies any further needs at this time. Will sign off acute OT.  ? ? ? ?Vision Patient Visual Report: No change from baseline ?   ?   ?   ?   ? ?Pertinent Vitals/Pain Pain Assessment ?Pain Assessment: 0-10 ?Pain Score: 4  ?Pain Location: back ?Pain Descriptors / Indicators: Aching, Discomfort ?Pain Intervention(s): Limited activity within patient's tolerance, Monitored during session, Repositioned  ? ? ? ?Hand Dominance Right ?  ?Extremity/Trunk Assessment Upper Extremity Assessment ?Upper Extremity Assessment: Overall WFL for tasks assessed ?  ?Lower Extremity Assessment ?Lower Extremity Assessment: Overall WFL for tasks assessed;Defer to PT evaluation ?  ?Cervical / Trunk Assessment ?Cervical / Trunk Assessment: Back Surgery ?  ?Communication Communication ?Communication: No difficulties ?  ?Cognition Arousal/Alertness: Awake/alert ?Behavior During Therapy: [facility] for tasks assessed/performed ?Overall Cognitive Status: Within Functional Limits for tasks assessed ?  ?  ?General Comments   Pt with supportive husband whom is able to provide necessary PRN assist at d/c. ? ?  ?   ?   ? ? ?Home Living Family/patient expects to be discharged to:: Private residence ?Living Arrangements: Spouse/significant other ?Available Help at Discharge: Family;Available 24 hours/day ?Type of Home: House ?Home Access: Stairs to enter ?Entrance Stairs-Number of Steps: 9 ?Entrance  Stairs-Rails: Right;Left;Can reach both ?Home Layout: Two level;Able to live on main level with bedroom/bathroom ?  ?  ?Bathroom Shower/Tub: Tub/shower unit ?  ?Bathroom Toilet: Standard ?  ?  ?Home Equipment: Shower seat ?  ?  ?Prior Functioning/Environment Prior Level of Function : Independent/Modified Independent;Working/employed;Driving ?  ?  ?ADLs Comments: Pt reports independence in all ADL, IADL's. Husband "spoils me" Pt husband works from home and available to provide PRN assist 24/7. ?  ? ?OT Problem List: Pain ?  ?   ?OT Treatment/Interventions:    ?  ?OT Goals(Current goals can be found in the care plan section) Acute Rehab OT Goals ?Patient Stated Goal: Go home today ?OT Goal Formulation: All assessment and education complete, DC therapy ?Time For Goal Achievement: 08/16/21  ?OT Frequency:  Eval only, sign off acute OT ?  ? ?   ?AM-PAC OT "6 Clicks" Daily Activity     ?Outcome Measure Help from another person eating meals?: None ?Help from another person taking care of personal grooming?: A Little ?Help from another person toileting, which includes using toliet, bedpan, or urinal?: None ?Help from another person bathing (including washing, rinsing, drying)?: A Little ?Help from another person to put on and taking off regular upper body clothing?: A Little ?Help from another person to put on and taking off regular lower body clothing?: A Little ?6 Click Score: 20 ?  ?End of Session Equipment Utilized During Treatment: Back brace ? ?Activity Tolerance: Patient tolerated treatment well ?Patient left: in bed;with call bell/phone within reach;with family/visitor present ? ?OT Visit Diagnosis: Pain ?Pain - Right/Left:  (Back) ?Pain - part of body:  (Back - surgical pain)  ?              ?Time: 4481-8563 ?OT Time Calculation (min): 11 min ?Charges:  OT General Charges ?$OT Visit: 1 Visit ?OT Evaluation ?$OT Eval Low Complexity: 1  Low ? ?Almyra Deforest, OTR/L ?08/16/2021, 8:49 AM ?

## 2021-08-17 MED FILL — Heparin Sodium (Porcine) Inj 1000 Unit/ML: INTRAMUSCULAR | Qty: 30 | Status: AC

## 2021-08-17 MED FILL — Sodium Chloride IV Soln 0.9%: INTRAVENOUS | Qty: 1000 | Status: AC

## 2021-09-20 DIAGNOSIS — M4317 Spondylolisthesis, lumbosacral region: Secondary | ICD-10-CM | POA: Diagnosis not present

## 2021-10-18 DIAGNOSIS — R102 Pelvic and perineal pain: Secondary | ICD-10-CM | POA: Diagnosis not present

## 2021-10-26 ENCOUNTER — Other Ambulatory Visit (HOSPITAL_COMMUNITY): Payer: Self-pay | Admitting: Internal Medicine

## 2021-10-26 DIAGNOSIS — Z1231 Encounter for screening mammogram for malignant neoplasm of breast: Secondary | ICD-10-CM

## 2021-10-30 DIAGNOSIS — R102 Pelvic and perineal pain: Secondary | ICD-10-CM | POA: Diagnosis not present

## 2021-10-30 DIAGNOSIS — R1031 Right lower quadrant pain: Secondary | ICD-10-CM | POA: Diagnosis not present

## 2021-10-30 DIAGNOSIS — Z780 Asymptomatic menopausal state: Secondary | ICD-10-CM | POA: Diagnosis not present

## 2021-10-31 ENCOUNTER — Ambulatory Visit (HOSPITAL_COMMUNITY)
Admission: RE | Admit: 2021-10-31 | Discharge: 2021-10-31 | Disposition: A | Discharge status: Home / Self Care | Payer: Medicare HMO | Source: Ambulatory Visit | Attending: Internal Medicine | Admitting: Internal Medicine

## 2021-10-31 DIAGNOSIS — Z1231 Encounter for screening mammogram for malignant neoplasm of breast: Secondary | ICD-10-CM | POA: Insufficient documentation

## 2021-11-02 ENCOUNTER — Encounter: Payer: Self-pay | Admitting: Podiatry

## 2021-11-02 ENCOUNTER — Ambulatory Visit: Payer: Medicare HMO | Admitting: Podiatry

## 2021-11-02 DIAGNOSIS — M7752 Other enthesopathy of left foot: Secondary | ICD-10-CM

## 2021-11-02 MED ORDER — TRIAMCINOLONE ACETONIDE 10 MG/ML IJ SUSP
10.0000 mg | Freq: Once | INTRAMUSCULAR | Status: AC
  Administered 2021-11-02: 10 mg

## 2021-11-02 NOTE — Progress Notes (Signed)
Subjective:   Patient ID: Judy Watts, female   DOB: 76 y.o.   MRN: [id number]   HPI Patient presents with pain in the left ankle stating that its been hard for her in the last few weeks but it does do well for about 4 to 5 months   ROS      Objective:  Physical Exam  Neurovascular status intact with exquisite discomfort in the sinus tarsi left inflammation fluid within the sinus tarsi     Assessment:  Sinus tarsitis left with inflammation     Plan:  Standard prep went ahead today injected the sinus tarsi left 3 mg Kenalog 5 mg Xylocaine and applied sterile dressing.  Reappoint as needed

## 2021-11-07 DIAGNOSIS — G43019 Migraine without aura, intractable, without status migrainosus: Secondary | ICD-10-CM | POA: Diagnosis not present

## 2021-11-07 DIAGNOSIS — G4700 Insomnia, unspecified: Secondary | ICD-10-CM | POA: Diagnosis not present

## 2021-11-07 DIAGNOSIS — R110 Nausea: Secondary | ICD-10-CM | POA: Diagnosis not present

## 2021-11-07 DIAGNOSIS — R1031 Right lower quadrant pain: Secondary | ICD-10-CM | POA: Diagnosis not present

## 2021-11-07 DIAGNOSIS — M545 Low back pain, unspecified: Secondary | ICD-10-CM | POA: Diagnosis not present

## 2021-11-19 DIAGNOSIS — G8929 Other chronic pain: Secondary | ICD-10-CM | POA: Diagnosis not present

## 2021-11-19 DIAGNOSIS — R42 Dizziness and giddiness: Secondary | ICD-10-CM | POA: Diagnosis not present

## 2021-12-12 DIAGNOSIS — K5790 Diverticulosis of intestine, part unspecified, without perforation or abscess without bleeding: Secondary | ICD-10-CM | POA: Diagnosis not present

## 2021-12-21 DIAGNOSIS — M4150 Other secondary scoliosis, site unspecified: Secondary | ICD-10-CM | POA: Diagnosis not present

## 2021-12-21 DIAGNOSIS — M4317 Spondylolisthesis, lumbosacral region: Secondary | ICD-10-CM | POA: Diagnosis not present

## 2021-12-31 DIAGNOSIS — Z0100 Encounter for examination of eyes and vision without abnormal findings: Secondary | ICD-10-CM | POA: Diagnosis not present

## 2021-12-31 DIAGNOSIS — I10 Essential (primary) hypertension: Secondary | ICD-10-CM | POA: Diagnosis not present

## 2021-12-31 DIAGNOSIS — H5200 Hypermetropia, unspecified eye: Secondary | ICD-10-CM | POA: Diagnosis not present

## 2022-01-02 ENCOUNTER — Encounter (HOSPITAL_COMMUNITY): Payer: Medicare HMO

## 2022-01-16 DIAGNOSIS — E785 Hyperlipidemia, unspecified: Secondary | ICD-10-CM | POA: Diagnosis not present

## 2022-01-22 DIAGNOSIS — G43019 Migraine without aura, intractable, without status migrainosus: Secondary | ICD-10-CM | POA: Diagnosis not present

## 2022-01-22 DIAGNOSIS — E782 Mixed hyperlipidemia: Secondary | ICD-10-CM | POA: Diagnosis not present

## 2022-01-22 DIAGNOSIS — E876 Hypokalemia: Secondary | ICD-10-CM | POA: Diagnosis not present

## 2022-01-22 DIAGNOSIS — Z0001 Encounter for general adult medical examination with abnormal findings: Secondary | ICD-10-CM | POA: Diagnosis not present

## 2022-01-22 DIAGNOSIS — M810 Age-related osteoporosis without current pathological fracture: Secondary | ICD-10-CM | POA: Diagnosis not present

## 2022-01-22 DIAGNOSIS — J302 Other seasonal allergic rhinitis: Secondary | ICD-10-CM | POA: Diagnosis not present

## 2022-01-22 DIAGNOSIS — E870 Hyperosmolality and hypernatremia: Secondary | ICD-10-CM | POA: Diagnosis not present

## 2022-01-22 DIAGNOSIS — N1831 Chronic kidney disease, stage 3a: Secondary | ICD-10-CM | POA: Diagnosis not present

## 2022-01-22 DIAGNOSIS — K219 Gastro-esophageal reflux disease without esophagitis: Secondary | ICD-10-CM | POA: Diagnosis not present

## 2022-01-23 ENCOUNTER — Encounter: Payer: Self-pay | Admitting: Podiatry

## 2022-01-23 ENCOUNTER — Ambulatory Visit: Payer: Medicare HMO | Admitting: Podiatry

## 2022-01-23 ENCOUNTER — Ambulatory Visit (INDEPENDENT_AMBULATORY_CARE_PROVIDER_SITE_OTHER): Payer: Medicare HMO

## 2022-01-23 DIAGNOSIS — M7752 Other enthesopathy of left foot: Secondary | ICD-10-CM | POA: Diagnosis not present

## 2022-01-23 MED ORDER — TRIAMCINOLONE ACETONIDE 10 MG/ML IJ SUSP
10.0000 mg | Freq: Once | INTRAMUSCULAR | Status: AC
  Administered 2022-01-23: 10 mg

## 2022-01-29 ENCOUNTER — Encounter (HOSPITAL_COMMUNITY)
Admission: RE | Admit: 2022-01-29 | Discharge: 2022-01-29 | Disposition: A | Discharge status: Home / Self Care | Payer: Medicare HMO | Source: Ambulatory Visit | Attending: Internal Medicine | Admitting: Internal Medicine

## 2022-01-29 ENCOUNTER — Other Ambulatory Visit (HOSPITAL_COMMUNITY): Payer: Self-pay

## 2022-01-29 DIAGNOSIS — M810 Age-related osteoporosis without current pathological fracture: Secondary | ICD-10-CM | POA: Diagnosis not present

## 2022-01-29 MED ORDER — DENOSUMAB 60 MG/ML ~~LOC~~ SOSY
60.0000 mg | PREFILLED_SYRINGE | Freq: Once | SUBCUTANEOUS | Status: AC
  Administered 2022-01-29: 60 mg via SUBCUTANEOUS

## 2022-01-29 NOTE — Progress Notes (Signed)
Diagnosis: Osteoporosis  Provider:  Zack Hall MD  Procedure: Injection  Prolia (Denosumab), Dose: 60 mg, Site: subcutaneous, Number of injections: 1  Discharge: Condition: Good, Destination: Home . AVS provided to patient.   Performed by:  Priscille Shadduck H, RN        

## 2022-01-30 NOTE — Progress Notes (Signed)
Subjective:   Patient ID: Judy Watts, female   DOB: 76 y.o.   MRN: [id number]   HPI Patient states her pain seems to be somewhat different than where it was but its been hurting on the outside of the left ankle not as much in the joint but more outside   ROS      Objective:  Physical Exam  Neurovascular status intact with inflammation pain of the sinus tarsi left but extending into the lateral ankle gutter left     Assessment:  Inflammatory capsulitis sinus tarsi lateral ankle gutter left     Plan:  Reviewed condition today I did a careful injection more into the lateral ankle gutter slightly into the sinus tarsi 3 mg Kenalog 5 mg Xylocaine reappoint as needed

## 2022-03-13 DIAGNOSIS — R300 Dysuria: Secondary | ICD-10-CM | POA: Diagnosis not present

## 2022-03-13 DIAGNOSIS — N760 Acute vaginitis: Secondary | ICD-10-CM | POA: Diagnosis not present

## 2022-03-13 DIAGNOSIS — N898 Other specified noninflammatory disorders of vagina: Secondary | ICD-10-CM | POA: Diagnosis not present

## 2022-04-09 ENCOUNTER — Other Ambulatory Visit: Payer: Self-pay

## 2022-04-29 ENCOUNTER — Encounter (INDEPENDENT_AMBULATORY_CARE_PROVIDER_SITE_OTHER): Payer: Self-pay | Admitting: Gastroenterology

## 2022-05-30 ENCOUNTER — Ambulatory Visit: Payer: Medicare HMO | Admitting: Podiatry

## 2022-05-30 ENCOUNTER — Encounter: Payer: Self-pay | Admitting: Podiatry

## 2022-05-30 DIAGNOSIS — M7752 Other enthesopathy of left foot: Secondary | ICD-10-CM

## 2022-05-30 MED ORDER — TRIAMCINOLONE ACETONIDE 10 MG/ML IJ SUSP
10.0000 mg | Freq: Once | INTRAMUSCULAR | Status: AC
  Administered 2022-05-30: 10 mg

## 2022-05-31 NOTE — Progress Notes (Signed)
Subjective:   Patient ID: Judy Watts, female   DOB: 76 y.o.   MRN: [id number]   HPI Patient presents with pain in the left ankle states it did well for about 4 months   ROS      Objective:  Physical Exam  Neurovascular status intact pain in the left sinus tarsi fluid buildup that responds well to medication     Assessment:  Chronic capsulitis of the sinus tarsi left     Plan:  Sterile prep injected the sinus tarsi left 3 mg Kenalog 5 mg Xylocaine applied sterile dressing reappoint to recheck

## 2022-07-04 DIAGNOSIS — L820 Inflamed seborrheic keratosis: Secondary | ICD-10-CM | POA: Diagnosis not present

## 2022-07-04 DIAGNOSIS — L570 Actinic keratosis: Secondary | ICD-10-CM | POA: Diagnosis not present

## 2022-07-04 DIAGNOSIS — X32XXXA Exposure to sunlight, initial encounter: Secondary | ICD-10-CM | POA: Diagnosis not present

## 2022-07-19 DIAGNOSIS — I10 Essential (primary) hypertension: Secondary | ICD-10-CM | POA: Diagnosis not present

## 2022-07-19 DIAGNOSIS — Z139 Encounter for screening, unspecified: Secondary | ICD-10-CM | POA: Diagnosis not present

## 2022-07-19 DIAGNOSIS — E785 Hyperlipidemia, unspecified: Secondary | ICD-10-CM | POA: Diagnosis not present

## 2022-07-23 ENCOUNTER — Other Ambulatory Visit: Payer: Self-pay

## 2022-07-24 ENCOUNTER — Telehealth: Payer: Self-pay | Admitting: Pharmacy Technician

## 2022-07-24 NOTE — Telephone Encounter (Signed)
Auth Submission: APPROVED @ [employer] Payer: [employer] MEDICARE Medication & CPT/J Code(s) submitted: Prolia (Denosumab) [id number] Route of submission (phone, fax, portal):  Phone # [phone] Fax [phone] Auth type: Buy/Bill Units/visits requested: X2 Reference number: [id number] Approval from: 06/28/21 to 06/17/23   Auto renewal - continuation of therapy

## 2022-07-29 DIAGNOSIS — M6281 Muscle weakness (generalized): Secondary | ICD-10-CM | POA: Diagnosis not present

## 2022-07-29 DIAGNOSIS — R2681 Unsteadiness on feet: Secondary | ICD-10-CM | POA: Diagnosis not present

## 2022-07-29 DIAGNOSIS — M4317 Spondylolisthesis, lumbosacral region: Secondary | ICD-10-CM | POA: Diagnosis not present

## 2022-07-30 ENCOUNTER — Encounter (HOSPITAL_COMMUNITY)
Admission: RE | Admit: 2022-07-30 | Discharge: 2022-07-30 | Disposition: A | Discharge status: Home / Self Care | Payer: Medicare HMO | Source: Ambulatory Visit | Attending: Family Medicine | Admitting: Family Medicine

## 2022-07-30 VITALS — BP 135/95 | HR 48 | Temp 97.3°F

## 2022-07-30 DIAGNOSIS — E876 Hypokalemia: Secondary | ICD-10-CM | POA: Diagnosis not present

## 2022-07-30 DIAGNOSIS — K219 Gastro-esophageal reflux disease without esophagitis: Secondary | ICD-10-CM | POA: Diagnosis not present

## 2022-07-30 DIAGNOSIS — G43019 Migraine without aura, intractable, without status migrainosus: Secondary | ICD-10-CM | POA: Diagnosis not present

## 2022-07-30 DIAGNOSIS — N1831 Chronic kidney disease, stage 3a: Secondary | ICD-10-CM | POA: Diagnosis not present

## 2022-07-30 DIAGNOSIS — M810 Age-related osteoporosis without current pathological fracture: Secondary | ICD-10-CM | POA: Insufficient documentation

## 2022-07-30 DIAGNOSIS — M25511 Pain in right shoulder: Secondary | ICD-10-CM | POA: Diagnosis not present

## 2022-07-30 DIAGNOSIS — J302 Other seasonal allergic rhinitis: Secondary | ICD-10-CM | POA: Diagnosis not present

## 2022-07-30 DIAGNOSIS — E782 Mixed hyperlipidemia: Secondary | ICD-10-CM | POA: Diagnosis not present

## 2022-07-30 DIAGNOSIS — G43009 Migraine without aura, not intractable, without status migrainosus: Secondary | ICD-10-CM | POA: Diagnosis not present

## 2022-07-30 MED ORDER — DENOSUMAB 60 MG/ML ~~LOC~~ SOSY
60.0000 mg | PREFILLED_SYRINGE | Freq: Once | SUBCUTANEOUS | Status: AC
  Administered 2022-07-30: 60 mg via SUBCUTANEOUS

## 2022-07-30 NOTE — Progress Notes (Signed)
Diagnosis: Osteoporosis  Provider:  Wende Neighbors MD  Procedure: Injection  Prolia (Denosumab), Dose: 60 mg, Site: subcutaneous, Number of injections: 1  Post Care: Patient declined observation  Discharge: Condition: Good, Destination: Home . AVS Provided  Performed by:  Baxter Hire, RN

## 2022-07-31 ENCOUNTER — Encounter (INDEPENDENT_AMBULATORY_CARE_PROVIDER_SITE_OTHER): Payer: Self-pay | Admitting: *Deleted

## 2022-07-31 DIAGNOSIS — M6281 Muscle weakness (generalized): Secondary | ICD-10-CM | POA: Diagnosis not present

## 2022-07-31 DIAGNOSIS — R2681 Unsteadiness on feet: Secondary | ICD-10-CM | POA: Diagnosis not present

## 2022-07-31 DIAGNOSIS — M4317 Spondylolisthesis, lumbosacral region: Secondary | ICD-10-CM | POA: Diagnosis not present

## 2022-08-01 ENCOUNTER — Encounter (HOSPITAL_COMMUNITY): Admission: RE | Admit: 2022-08-01 | Payer: Medicare HMO | Source: Ambulatory Visit

## 2022-08-06 DIAGNOSIS — M4317 Spondylolisthesis, lumbosacral region: Secondary | ICD-10-CM | POA: Diagnosis not present

## 2022-08-06 DIAGNOSIS — M47816 Spondylosis without myelopathy or radiculopathy, lumbar region: Secondary | ICD-10-CM | POA: Diagnosis not present

## 2022-08-07 DIAGNOSIS — R2681 Unsteadiness on feet: Secondary | ICD-10-CM | POA: Diagnosis not present

## 2022-08-07 DIAGNOSIS — M6281 Muscle weakness (generalized): Secondary | ICD-10-CM | POA: Diagnosis not present

## 2022-08-07 DIAGNOSIS — M4317 Spondylolisthesis, lumbosacral region: Secondary | ICD-10-CM | POA: Diagnosis not present

## 2022-08-13 DIAGNOSIS — U071 COVID-19: Secondary | ICD-10-CM | POA: Diagnosis not present

## 2022-08-15 ENCOUNTER — Encounter: Payer: Self-pay | Admitting: Radiology

## 2022-08-20 DIAGNOSIS — M6281 Muscle weakness (generalized): Secondary | ICD-10-CM | POA: Diagnosis not present

## 2022-08-20 DIAGNOSIS — M4317 Spondylolisthesis, lumbosacral region: Secondary | ICD-10-CM | POA: Diagnosis not present

## 2022-08-20 DIAGNOSIS — R2681 Unsteadiness on feet: Secondary | ICD-10-CM | POA: Diagnosis not present

## 2022-08-21 DIAGNOSIS — M6281 Muscle weakness (generalized): Secondary | ICD-10-CM | POA: Diagnosis not present

## 2022-08-21 DIAGNOSIS — M4317 Spondylolisthesis, lumbosacral region: Secondary | ICD-10-CM | POA: Diagnosis not present

## 2022-08-21 DIAGNOSIS — R2681 Unsteadiness on feet: Secondary | ICD-10-CM | POA: Diagnosis not present

## 2022-08-27 DIAGNOSIS — R2681 Unsteadiness on feet: Secondary | ICD-10-CM | POA: Diagnosis not present

## 2022-08-27 DIAGNOSIS — M6281 Muscle weakness (generalized): Secondary | ICD-10-CM | POA: Diagnosis not present

## 2022-08-27 DIAGNOSIS — M4317 Spondylolisthesis, lumbosacral region: Secondary | ICD-10-CM | POA: Diagnosis not present

## 2022-09-03 DIAGNOSIS — M6281 Muscle weakness (generalized): Secondary | ICD-10-CM | POA: Diagnosis not present

## 2022-09-03 DIAGNOSIS — R2681 Unsteadiness on feet: Secondary | ICD-10-CM | POA: Diagnosis not present

## 2022-09-03 DIAGNOSIS — M4317 Spondylolisthesis, lumbosacral region: Secondary | ICD-10-CM | POA: Diagnosis not present

## 2022-09-05 DIAGNOSIS — M4317 Spondylolisthesis, lumbosacral region: Secondary | ICD-10-CM | POA: Diagnosis not present

## 2022-09-05 DIAGNOSIS — M6281 Muscle weakness (generalized): Secondary | ICD-10-CM | POA: Diagnosis not present

## 2022-09-05 DIAGNOSIS — R2681 Unsteadiness on feet: Secondary | ICD-10-CM | POA: Diagnosis not present

## 2022-09-10 DIAGNOSIS — M4317 Spondylolisthesis, lumbosacral region: Secondary | ICD-10-CM | POA: Diagnosis not present

## 2022-09-10 DIAGNOSIS — R2681 Unsteadiness on feet: Secondary | ICD-10-CM | POA: Diagnosis not present

## 2022-09-10 DIAGNOSIS — M6281 Muscle weakness (generalized): Secondary | ICD-10-CM | POA: Diagnosis not present

## 2022-09-12 DIAGNOSIS — R2681 Unsteadiness on feet: Secondary | ICD-10-CM | POA: Diagnosis not present

## 2022-09-12 DIAGNOSIS — M6281 Muscle weakness (generalized): Secondary | ICD-10-CM | POA: Diagnosis not present

## 2022-09-12 DIAGNOSIS — M4317 Spondylolisthesis, lumbosacral region: Secondary | ICD-10-CM | POA: Diagnosis not present

## 2022-10-07 ENCOUNTER — Ambulatory Visit (INDEPENDENT_AMBULATORY_CARE_PROVIDER_SITE_OTHER): Payer: Medicare HMO | Admitting: Gastroenterology

## 2022-10-07 ENCOUNTER — Encounter (INDEPENDENT_AMBULATORY_CARE_PROVIDER_SITE_OTHER): Payer: Self-pay | Admitting: Gastroenterology

## 2022-10-07 ENCOUNTER — Telehealth (INDEPENDENT_AMBULATORY_CARE_PROVIDER_SITE_OTHER): Payer: Self-pay | Admitting: Gastroenterology

## 2022-10-07 ENCOUNTER — Encounter (HOSPITAL_COMMUNITY): Payer: Self-pay | Admitting: Internal Medicine

## 2022-10-07 VITALS — BP 116/76 | HR 48 | Temp 97.8°F | Ht 64.0 in | Wt 131.0 lb

## 2022-10-07 DIAGNOSIS — G8929 Other chronic pain: Secondary | ICD-10-CM | POA: Diagnosis not present

## 2022-10-07 DIAGNOSIS — R1031 Right lower quadrant pain: Secondary | ICD-10-CM | POA: Insufficient documentation

## 2022-10-07 MED ORDER — PEG 3350-KCL-NA BICARB-NACL 420 G PO SOLR: 4000.0000 mL | Freq: Once | ORAL | 0 refills | Status: AC
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
## 2022-10-07 NOTE — Patient Instructions (Signed)
We will get you scheduled for colonoscopy for further evaluation of your symptoms You can continue to use dicyclomine as needed for now  Follow up 3 months

## 2022-10-07 NOTE — Telephone Encounter (Signed)
Pt needing 2 day prep; called pt to let her know when to start 2 day prep. Will send updated instructions via mail.   Approved Authorization #[id number]  Tracking [id number] 10/07/22-01/06/23

## 2022-10-23 ENCOUNTER — Ambulatory Visit: Payer: Medicare HMO | Admitting: Podiatry

## 2022-10-23 DIAGNOSIS — M778 Other enthesopathies, not elsewhere classified: Secondary | ICD-10-CM

## 2022-10-23 MED ORDER — TRIAMCINOLONE ACETONIDE 10 MG/ML IJ SUSP
10.0000 mg | Freq: Once | INTRAMUSCULAR | Status: AC
  Administered 2022-10-23: 10 mg

## 2022-10-23 NOTE — Progress Notes (Signed)
Subjective:   Patient ID: Judy Watts, female   DOB: 77 y.o.   MRN: [id number]   HPI Patient states she did well but the ankle has started to hurt again   ROS      Objective:  Physical Exam  Neuro vascular status intact inflammation of the sinus tarsi ankle joint left     Assessment:  Acute capsulitis left with inflammation     Plan:  Sterile prep went ahead and injected the sinus tarsi left 3 mg Kenalog 5 mg Xylocaine advised on stretching good support shoes reappoint as needed

## 2022-10-24 ENCOUNTER — Other Ambulatory Visit (HOSPITAL_COMMUNITY): Payer: Self-pay | Admitting: Internal Medicine

## 2022-10-24 DIAGNOSIS — Z1231 Encounter for screening mammogram for malignant neoplasm of breast: Secondary | ICD-10-CM

## 2022-10-28 ENCOUNTER — Telehealth (INDEPENDENT_AMBULATORY_CARE_PROVIDER_SITE_OTHER): Payer: Self-pay | Admitting: Gastroenterology

## 2022-10-28 NOTE — Telephone Encounter (Signed)
Pt contacted and made aware of pre op appt scheduled for 11/01/22 at 11:30 [facility].

## 2022-10-30 NOTE — Patient Instructions (Signed)
Judy Watts  10/30/2022     @PREFPERIOPPHARMACY @   Your procedure is scheduled on  11/05/2022.   Report to [facility] at  0945  A.M.   Call this number if you have problems the morning of surgery:  [phone]  If you experience any cold or flu symptoms such as cough, fever, chills, shortness of breath, etc. between now and your scheduled surgery, please notify us at the above number.   Remember:  Follow the diet and prep instructions given to you by the office.     Take these medicines the morning of surgery with A SIP OF WATER           flexeril(if needed), gabapentin, omeprazole, zofran (if needed), oxycodone(if needed), propranolol, topamax.     Do not wear jewelry, make-up or nail polish.  Do not wear lotions, powders, or perfumes, or deodorant.  Do not shave 48 hours prior to surgery.  Men may shave face and neck.  Do not bring valuables to the hospital.  [facility] is not responsible for any belongings or valuables.  Contacts, dentures or bridgework may not be worn into surgery.  Leave your suitcase in the car.  After surgery it may be brought to your room.  For patients admitted to the hospital, discharge time will be determined by your treatment team.  Patients discharged the day of surgery will not be allowed to drive home and must have someone with them for 24 hours.    Special instructions:   DO NOT smoke tobacco or vape for 24 hours before your procedure.  Please read over the following fact sheets that you were given. Anesthesia Post-op Instructions and Care and Recovery After Surgery      Colonoscopy, Adult, Care After The following information offers guidance on how to care for yourself after your procedure. Your health care provider may also give you more specific instructions. If you have problems or questions, contact your health care provider. What can I expect after the procedure? After the procedure, it is common to have: A small  amount of blood in your stool for 24 hours after the procedure. Some gas. Mild cramping or bloating of your abdomen. Follow these instructions at home: Eating and drinking  Drink enough fluid to keep your urine pale yellow. Follow instructions from your health care provider about eating or drinking restrictions. Resume your normal diet as told by your health care provider. Avoid heavy or fried foods that are hard to digest. Activity Rest as told by your health care provider. Avoid sitting for a long time without moving. Get up to take short walks every 1-2 hours. This is important to improve blood flow and breathing. Ask for help if you feel weak or unsteady. Return to your normal activities as told by your health care provider. Ask your health care provider what activities are safe for you. Managing cramping and bloating  Try walking around when you have cramps or feel bloated. If directed, apply heat to your abdomen as told by your health care provider. Use the heat source that your health care provider recommends, such as a moist heat pack or a heating pad. Place a towel between your skin and the heat source. Leave the heat on for 20-30 minutes. Remove the heat if your skin turns bright red. This is especially important if you are unable to feel pain, heat, or cold. You have a greater risk of getting burned. General instructions  If you were given a sedative during the procedure, it can affect you for several hours. Do not drive or operate machinery until your health care provider says that it is safe. For the first 24 hours after the procedure: Do not sign important documents. Do not drink alcohol. Do your regular daily activities at a slower pace than normal. Eat soft foods that are easy to digest. Take over-the-counter and prescription medicines only as told by your health care provider. Keep all follow-up visits. This is important. Contact a health care provider if: You have blood  in your stool 2-3 days after the procedure. Get help right away if: You have more than a small spotting of blood in your stool. You have large blood clots in your stool. You have swelling of your abdomen. You have nausea or vomiting. You have a fever. You have increasing pain in your abdomen that is not relieved with medicine. These symptoms may be an emergency. Get help right away. Call 911. Do not wait to see if the symptoms will go away. Do not drive yourself to the hospital. Summary After the procedure, it is common to have a small amount of blood in your stool. You may also have mild cramping and bloating of your abdomen. If you were given a sedative during the procedure, it can affect you for several hours. Do not drive or operate machinery until your health care provider says that it is safe. Get help right away if you have a lot of blood in your stool, nausea or vomiting, a fever, or increased pain in your abdomen. This information is not intended to replace advice given to you by your health care provider. Make sure you discuss any questions you have with your health care provider. Document Revised: 01/24/2021 Document Reviewed: 01/24/2021 Elsevier Patient Education  2023 [employer] Monitored Anesthesia Care, Care After The following information offers guidance on how to care for yourself after your procedure. Your health care provider may also give you more specific instructions. If you have problems or questions, contact your health care provider. What can I expect after the procedure? After the procedure, it is common to have: Tiredness. Little or no memory about what happened during or after the procedure. Impaired judgment when it comes to making decisions. Nausea or vomiting. Some trouble with balance. Follow these instructions at home: For the time period you were told by your health care provider:  Rest. Do not participate in activities where you could fall or  become injured. Do not drive or use machinery. Do not drink alcohol. Do not take sleeping pills or medicines that cause drowsiness. Do not make important decisions or sign legal documents. Do not take care of children on your own. Medicines Take over-the-counter and prescription medicines only as told by your health care provider. If you were prescribed antibiotics, take them as told by your health care provider. Do not stop using the antibiotic even if you start to feel better. Eating and drinking Follow instructions from your health care provider about what you may eat and drink. Drink enough fluid to keep your urine pale yellow. If you vomit: Drink clear fluids slowly and in small amounts as you are able. Clear fluids include water, ice chips, low-calorie sports drinks, and fruit juice that has water added to it (diluted fruit juice). Eat light and bland foods in small amounts as you are able. These foods include bananas, applesauce, rice, lean meats, toast, and crackers. General instructions  Have  a responsible adult stay with you for the time you are told. It is important to have someone help care for you until you are awake and alert. If you have sleep apnea, surgery and some medicines can increase your risk for breathing problems. Follow instructions from your health care provider about wearing your sleep device: When you are sleeping. This includes during daytime naps. While taking prescription pain medicines, sleeping medicines, or medicines that make you drowsy. Do not use any products that contain nicotine or tobacco. These products include cigarettes, chewing tobacco, and vaping devices, such as e-cigarettes. If you need help quitting, ask your health care provider. Contact a health care provider if: You feel nauseous or vomit every time you eat or drink. You feel light-headed. You are still sleepy or having trouble with balance after 24 hours. You get a rash. You have a  fever. You have redness or swelling around the IV site. Get help right away if: You have trouble breathing. You have new confusion after you get home. These symptoms may be an emergency. Get help right away. Call 911. Do not wait to see if the symptoms will go away. Do not drive yourself to the hospital. This information is not intended to replace advice given to you by your health care provider. Make sure you discuss any questions you have with your health care provider. Document Revised: 10/29/2021 Document Reviewed: 10/29/2021 Elsevier Patient Education  2023 [employer].

## 2022-11-01 ENCOUNTER — Encounter (HOSPITAL_COMMUNITY)
Admission: RE | Admit: 2022-11-01 | Discharge: 2022-11-01 | Disposition: A | Discharge status: Home / Self Care | Payer: Medicare HMO | Source: Ambulatory Visit | Attending: Gastroenterology | Admitting: Gastroenterology

## 2022-11-01 ENCOUNTER — Encounter (HOSPITAL_COMMUNITY): Payer: Self-pay

## 2022-11-01 VITALS — BP 133/67 | HR 45 | Resp 18 | Ht 64.0 in | Wt 131.0 lb

## 2022-11-01 DIAGNOSIS — E876 Hypokalemia: Secondary | ICD-10-CM | POA: Diagnosis not present

## 2022-11-01 DIAGNOSIS — Z01818 Encounter for other preprocedural examination: Secondary | ICD-10-CM | POA: Insufficient documentation

## 2022-11-01 DIAGNOSIS — I10 Essential (primary) hypertension: Secondary | ICD-10-CM | POA: Diagnosis not present

## 2022-11-01 LAB — BASIC METABOLIC PANEL
Anion gap: 8 (ref 5–15)
BUN: 18 mg/dL (ref 8–23)
CO2: 29 mmol/L (ref 22–32)
Calcium: 9.5 mg/dL (ref 8.9–10.3)
Chloride: 102 mmol/L (ref 98–111)
Creatinine, Ser: 0.8 mg/dL (ref 0.44–1.00)
GFR, Estimated: >60 mL/min (ref >60)
Glucose, Bld: 102 mg/dL — ABNORMAL HIGH (ref 70–99)
Potassium: 2.8 mmol/L — ABNORMAL LOW (ref 3.5–5.1)
Sodium: 139 mmol/L (ref 135–145)

## 2022-11-05 ENCOUNTER — Ambulatory Visit (HOSPITAL_COMMUNITY): Payer: Medicare HMO | Admitting: Anesthesiology

## 2022-11-05 ENCOUNTER — Encounter (HOSPITAL_COMMUNITY)
Admission: RE | Disposition: A | Discharge status: Home / Self Care | Payer: Self-pay | Source: Ambulatory Visit | Attending: Gastroenterology

## 2022-11-05 ENCOUNTER — Ambulatory Visit (HOSPITAL_BASED_OUTPATIENT_CLINIC_OR_DEPARTMENT_OTHER): Payer: Medicare HMO | Admitting: Anesthesiology

## 2022-11-05 ENCOUNTER — Telehealth (INDEPENDENT_AMBULATORY_CARE_PROVIDER_SITE_OTHER): Payer: Self-pay | Admitting: Gastroenterology

## 2022-11-05 ENCOUNTER — Encounter (HOSPITAL_COMMUNITY): Payer: Self-pay | Admitting: Gastroenterology

## 2022-11-05 ENCOUNTER — Ambulatory Visit (HOSPITAL_COMMUNITY)
Admission: RE | Admit: 2022-11-05 | Discharge: 2022-11-05 | Disposition: A | Discharge status: Home / Self Care | Payer: Medicare HMO | Source: Ambulatory Visit | Attending: Gastroenterology | Admitting: Gastroenterology

## 2022-11-05 DIAGNOSIS — K6289 Other specified diseases of anus and rectum: Secondary | ICD-10-CM | POA: Diagnosis not present

## 2022-11-05 DIAGNOSIS — G8929 Other chronic pain: Secondary | ICD-10-CM

## 2022-11-05 DIAGNOSIS — R1011 Right upper quadrant pain: Secondary | ICD-10-CM | POA: Insufficient documentation

## 2022-11-05 DIAGNOSIS — Z79899 Other long term (current) drug therapy: Secondary | ICD-10-CM | POA: Insufficient documentation

## 2022-11-05 DIAGNOSIS — K5730 Diverticulosis of large intestine without perforation or abscess without bleeding: Secondary | ICD-10-CM

## 2022-11-05 DIAGNOSIS — G43909 Migraine, unspecified, not intractable, without status migrainosus: Secondary | ICD-10-CM | POA: Diagnosis not present

## 2022-11-05 DIAGNOSIS — R933 Abnormal findings on diagnostic imaging of other parts of digestive tract: Secondary | ICD-10-CM | POA: Diagnosis not present

## 2022-11-05 DIAGNOSIS — R1903 Right lower quadrant abdominal swelling, mass and lump: Secondary | ICD-10-CM | POA: Diagnosis not present

## 2022-11-05 DIAGNOSIS — K648 Other hemorrhoids: Secondary | ICD-10-CM | POA: Diagnosis not present

## 2022-11-05 DIAGNOSIS — K219 Gastro-esophageal reflux disease without esophagitis: Secondary | ICD-10-CM | POA: Insufficient documentation

## 2022-11-05 DIAGNOSIS — M1990 Unspecified osteoarthritis, unspecified site: Secondary | ICD-10-CM | POA: Insufficient documentation

## 2022-11-05 DIAGNOSIS — N189 Chronic kidney disease, unspecified: Secondary | ICD-10-CM | POA: Insufficient documentation

## 2022-11-05 DIAGNOSIS — I10 Essential (primary) hypertension: Secondary | ICD-10-CM

## 2022-11-05 DIAGNOSIS — I129 Hypertensive chronic kidney disease with stage 1 through stage 4 chronic kidney disease, or unspecified chronic kidney disease: Secondary | ICD-10-CM | POA: Insufficient documentation

## 2022-11-05 DIAGNOSIS — R1031 Right lower quadrant pain: Secondary | ICD-10-CM | POA: Diagnosis not present

## 2022-11-05 HISTORY — PX: BIOPSY: SHX5522

## 2022-11-05 HISTORY — PX: COLONOSCOPY WITH PROPOFOL: SHX5780

## 2022-11-05 LAB — BASIC METABOLIC PANEL
Anion gap: 7 (ref 5–15)
BUN: 13 mg/dL (ref 8–23)
CO2: 25 mmol/L (ref 22–32)
Calcium: 8.6 mg/dL — ABNORMAL LOW (ref 8.9–10.3)
Chloride: 106 mmol/L (ref 98–111)
Creatinine, Ser: 0.77 mg/dL (ref 0.44–1.00)
GFR, Estimated: >60 mL/min (ref >60)
Glucose, Bld: 95 mg/dL (ref 70–99)
Potassium: 3.2 mmol/L — ABNORMAL LOW (ref 3.5–5.1)
Sodium: 138 mmol/L (ref 135–145)

## 2022-11-05 LAB — HM COLONOSCOPY

## 2022-11-05 SURGERY — COLONOSCOPY WITH PROPOFOL
Anesthesia: General

## 2022-11-05 MED ORDER — EPHEDRINE SULFATE-NACL 50-0.9 MG/10ML-% IV SOSY
PREFILLED_SYRINGE | INTRAVENOUS | Status: DC | PRN
  Administered 2022-11-05 (×2): 10 mg via INTRAVENOUS

## 2022-11-05 MED ORDER — PROPOFOL 1000 MG/100ML IV EMUL
INTRAVENOUS | Status: AC
  Filled 2022-11-05: qty 100

## 2022-11-05 MED ORDER — PROPOFOL 10 MG/ML IV BOLUS
INTRAVENOUS | Status: DC | PRN
  Administered 2022-11-05: 100 mg via INTRAVENOUS

## 2022-11-05 MED ORDER — PROPOFOL 500 MG/50ML IV EMUL
INTRAVENOUS | Status: DC | PRN
  Administered 2022-11-05: 150 ug/kg/min via INTRAVENOUS

## 2022-11-05 MED ORDER — EPHEDRINE 5 MG/ML INJ
INTRAVENOUS | Status: AC
  Filled 2022-11-05: qty 5

## 2022-11-05 MED ORDER — LIDOCAINE HCL (CARDIAC) PF 100 MG/5ML IV SOSY
PREFILLED_SYRINGE | INTRAVENOUS | Status: DC | PRN
  Administered 2022-11-05: 50 mg via INTRAVENOUS

## 2022-11-05 MED ORDER — LACTATED RINGERS IV SOLN: INTRAVENOUS | Status: DC

## 2022-11-05 NOTE — Op Note (Signed)
[facility] Patient Name: Judy Watts Procedure Date: 11/05/2022 8:56 AM MRN: [id number] Date of Birth: Oct 18, 1945 Attending MD: Katrinka Blazing , , [id number] CSN: [id number] Age: 77 Admit Type: Outpatient Procedure:                Colonoscopy Indications:              Abdominal pain in the right upper quadrant Providers:                Katrinka Blazing, Crystal Page, Pandora Leiter,                            Technician Referring MD:              Medicines:                Monitored Anesthesia Care Complications:            No immediate complications. Estimated Blood Loss:     Estimated blood loss: none. Procedure:                Pre-Anesthesia Assessment:                           - Prior to the procedure, a History and Physical                            was performed, and patient medications, allergies                            and sensitivities were reviewed. The patient's                            tolerance of previous anesthesia was reviewed.                           - The risks and benefits of the procedure and the                            sedation options and risks were discussed with the                            patient. All questions were answered and informed                            consent was obtained.                           - ASA Grade Assessment: II - A patient with mild                            systemic disease.                           After obtaining informed consent, the colonoscope                            was passed under direct vision. Throughout the  procedure, the patient's blood pressure, pulse, and                            oxygen saturations were monitored continuously. The                            [id number]-HQ190L ([id number]) scope was introduced through                            the anus and advanced to the the cecum, identified                            by appendiceal orifice and ileocecal valve. The                             colonoscopy was performed without difficulty. The                            patient tolerated the procedure well. The quality                            of the bowel preparation was adequate. Scope In: 9:09:37 AM Scope Out: 9:29:25 AM Scope Withdrawal Time: 0 hours 15 minutes 29 seconds  Total Procedure Duration: 0 hours 19 minutes 48 seconds  Findings:      The perianal and digital rectal examinations were normal.      Multiple large-mouthed and small-mouthed diverticula were found in the       sigmoid colon and descending colon.      Two medium scars were found in the distal rectum. The scar tissue was       healthy in appearance. Biopsies were taken with a cold forceps for       histology from one of the scars as it was rounded - no clear adenomatous       changes were seen in the center of the surrounded tissue.      Non-bleeding internal hemorrhoids were found during retroflexion. The       hemorrhoids were small. Impression:               - Diverticulosis in the sigmoid colon and in the                            descending colon.                           - Scars in the rectum. Biopsied.                           - Non-bleeding internal hemorrhoids. Moderate Sedation:      Per Anesthesia Care Recommendation:           - Discharge patient to home (ambulatory).                           - Resume previous diet.                           -  Await pathology results.                           - Repeat colonoscopy in 10 years for screening                            purposes.                           - Schedule EGD fro evaluation of pain. Procedure Code(s):        --- Professional ---                           [id number], Colonoscopy, flexible; with biopsy, single                            or multiple Diagnosis Code(s):        --- Professional ---                           K64.8, Other hemorrhoids                           K62.89, Other specified diseases of anus  and rectum                           R10.11, Right upper quadrant pain                           K57.30, Diverticulosis of large intestine without                            perforation or abscess without bleeding CPT copyright 2022 American Medical Association. All rights reserved. The codes documented in this report are preliminary and upon coder review may  be revised to meet current compliance requirements. Katrinka Blazing, MD Katrinka Blazing,  11/05/2022 9:36:56 AM This report has been signed electronically. Number of Addenda: 0

## 2022-11-05 NOTE — Interval H&P Note (Signed)
History and Physical Interval Note:  11/05/2022 7:29 AM  Judy Watts Stable  has presented today for surgery, with the diagnosis of RLQ PAIN.  The various methods of treatment have been discussed with the patient and family. After consideration of risks, benefits and other options for treatment, the patient has consented to  Procedure(s) with comments: COLONOSCOPY WITH PROPOFOL (N/A) - 11:45AM;ASA 3 as a surgical intervention.  The patient's history has been reviewed, patient examined, no change in status, stable for surgery.  I have reviewed the patient's chart and labs.  Questions were answered to the patient's satisfaction.     Katrinka Blazing Mayorga

## 2022-11-05 NOTE — Anesthesia Preprocedure Evaluation (Signed)
Anesthesia Evaluation  Patient identified by MRN, date of birth, ID band Patient awake    Reviewed: Allergy & Precautions, H&P , NPO status , Patient's Chart, lab work & pertinent test results, reviewed documented beta blocker date and time   History of Anesthesia Complications Negative for: history of anesthetic complications  Airway Mallampati: II  TM Distance: >3 FB Neck ROM: Full    Dental  (+) Dental Advisory Given, Upper Dentures   Pulmonary neg pulmonary ROS   Pulmonary exam normal breath sounds clear to auscultation       Cardiovascular Exercise Tolerance: Good hypertension, Pt. on medications and Pt. on home beta blockers Normal cardiovascular exam Rhythm:Regular Rate:Normal     Neuro/Psych  Headaches  negative psych ROS   GI/Hepatic Neg liver ROS,GERD  Medicated,,  Endo/Other  negative endocrine ROS    Renal/GU negative Renal ROS  negative genitourinary   Musculoskeletal  (+) Arthritis , Osteoarthritis,    Abdominal   Peds negative pediatric ROS (+)  Hematology negative hematology ROS (+)   Anesthesia Other Findings   Reproductive/Obstetrics negative OB ROS                             Anesthesia Physical Anesthesia Plan  ASA: 2  Anesthesia Plan: General   Post-op Pain Management: Minimal or no pain anticipated   Induction: Intravenous  PONV Risk Score and Plan: 1  Airway Management Planned: Nasal Cannula and Natural Airway  Additional Equipment:   Intra-op Plan:   Post-operative Plan:   Informed Consent: I have reviewed the patients History and Physical, chart, labs and discussed the procedure including the risks, benefits and alternatives for the proposed anesthesia with the patient or authorized representative who has indicated his/her understanding and acceptance.     Dental advisory given  Plan Discussed with: CRNA and Surgeon  Anesthesia Plan  Comments:         Anesthesia Quick Evaluation

## 2022-11-05 NOTE — Anesthesia Procedure Notes (Signed)
Date/Time: 11/05/2022 9:03 AM  Performed by: Julian Reil, CRNAPre-anesthesia Checklist: Patient identified, Emergency Drugs available, Suction available and Patient being monitored Patient Re-evaluated:Patient Re-evaluated prior to induction Oxygen Delivery Method: Nasal cannula Induction Type: IV induction Placement Confirmation: positive ETCO2

## 2022-11-05 NOTE — Telephone Encounter (Signed)
Pt contacted and EGD scheduled for 12/04/22 at 2:00pm. Instructions mailed to patient PA approved via [employer]-Authorization #[id number]  Tracking #[id number] DOS 11/05/22-02/03/23

## 2022-11-05 NOTE — Telephone Encounter (Signed)
Dolores Frame, MD  Marlowe Shores, LPN Hi Kenney Houseman,  Can you please schedule a EGD? Dx: RUQ pain. Room: any  Thanks,  Katrinka Blazing, MD Gastroenterology and Hepatology [facility]

## 2022-11-05 NOTE — Discharge Instructions (Signed)
You are being discharged to home.  Resume your previous diet.  We are waiting for your pathology results.  Your physician has recommended a repeat colonoscopy in 10 years for screening purposes.  Schedule EGD fro evaluation of pain.

## 2022-11-05 NOTE — Anesthesia Postprocedure Evaluation (Signed)
Anesthesia Post Note  Patient: Judy Watts  Procedure(s) Performed: COLONOSCOPY WITH PROPOFOL BIOPSY  Patient location during evaluation: Phase II Anesthesia Type: General Level of consciousness: awake and alert and oriented Pain management: pain level controlled Vital Signs Assessment: post-procedure vital signs reviewed and stable Respiratory status: spontaneous breathing, nonlabored ventilation and respiratory function stable Cardiovascular status: blood pressure returned to baseline and stable Postop Assessment: no apparent nausea or vomiting Anesthetic complications: no  No notable events documented.   Last Vitals:  Vitals:   11/05/22 0723 11/05/22 0933  BP: (!) 148/84 130/71  Pulse: (!) 54 (!) 58  Resp: 15 15  Temp: (!) 36.4 C (!) 36.4 C  SpO2: 99% 100%    Last Pain:  Vitals:   11/05/22 0933  TempSrc: Oral  PainSc: 0-No pain                 Hlee Fringer C Mesiah Manzo

## 2022-11-05 NOTE — Transfer of Care (Signed)
Immediate Anesthesia Transfer of Care Note  Patient: Judy Watts  Procedure(s) Performed: COLONOSCOPY WITH PROPOFOL BIOPSY  Patient Location: Short Stay  Anesthesia Type:General  Level of Consciousness: drowsy  Airway & Oxygen Therapy: Patient Spontanous Breathing  Post-op Assessment: Report given to RN and Post -op Vital signs reviewed and stable  Post vital signs: Reviewed and stable  Last Vitals:  Vitals Value Taken Time  BP    Temp    Pulse    Resp    SpO2      Last Pain:  Vitals:   11/05/22 0903  TempSrc:   PainSc: 0-No pain         Complications: No notable events documented.

## 2022-11-06 ENCOUNTER — Encounter (INDEPENDENT_AMBULATORY_CARE_PROVIDER_SITE_OTHER): Payer: Self-pay | Admitting: *Deleted

## 2022-11-06 LAB — SURGICAL PATHOLOGY

## 2022-11-08 ENCOUNTER — Encounter (HOSPITAL_COMMUNITY): Payer: Self-pay | Admitting: Gastroenterology

## 2022-11-13 ENCOUNTER — Ambulatory Visit (HOSPITAL_COMMUNITY): Payer: Medicare HMO

## 2022-12-03 ENCOUNTER — Telehealth (INDEPENDENT_AMBULATORY_CARE_PROVIDER_SITE_OTHER): Payer: Self-pay | Admitting: Gastroenterology

## 2022-12-03 NOTE — Telephone Encounter (Signed)
Pt left voicemail asking for a call back because she has two different times for procedure tomorrow. Pt is on for EGD at 2 pm with [facility].  Returned call to pt. Pt state she has 8 am on her phone but paper (instructions) say 12:30. Looked in chart and on Snap[employer] to see if pt had been moved up. Pt still on for 2:00. Advised pt to arrive at 12:30 pm. Pt verbalized understanding.

## 2022-12-04 ENCOUNTER — Ambulatory Visit (HOSPITAL_COMMUNITY): Payer: Medicare HMO | Admitting: Anesthesiology

## 2022-12-04 ENCOUNTER — Ambulatory Visit (HOSPITAL_COMMUNITY)
Admission: RE | Admit: 2022-12-04 | Discharge: 2022-12-04 | Disposition: A | Discharge status: Home / Self Care | Payer: Medicare HMO | Source: Ambulatory Visit | Attending: Gastroenterology | Admitting: Gastroenterology

## 2022-12-04 ENCOUNTER — Encounter (HOSPITAL_COMMUNITY)
Admission: RE | Disposition: A | Discharge status: Home / Self Care | Payer: Self-pay | Source: Ambulatory Visit | Attending: Gastroenterology

## 2022-12-04 ENCOUNTER — Other Ambulatory Visit: Payer: Self-pay

## 2022-12-04 ENCOUNTER — Ambulatory Visit (HOSPITAL_BASED_OUTPATIENT_CLINIC_OR_DEPARTMENT_OTHER): Payer: Medicare HMO | Admitting: Anesthesiology

## 2022-12-04 ENCOUNTER — Encounter (HOSPITAL_COMMUNITY): Payer: Self-pay | Admitting: Gastroenterology

## 2022-12-04 DIAGNOSIS — K3189 Other diseases of stomach and duodenum: Secondary | ICD-10-CM | POA: Insufficient documentation

## 2022-12-04 DIAGNOSIS — G8929 Other chronic pain: Secondary | ICD-10-CM | POA: Insufficient documentation

## 2022-12-04 DIAGNOSIS — K219 Gastro-esophageal reflux disease without esophagitis: Secondary | ICD-10-CM | POA: Insufficient documentation

## 2022-12-04 DIAGNOSIS — K317 Polyp of stomach and duodenum: Secondary | ICD-10-CM | POA: Diagnosis not present

## 2022-12-04 DIAGNOSIS — K319 Disease of stomach and duodenum, unspecified: Secondary | ICD-10-CM | POA: Diagnosis not present

## 2022-12-04 DIAGNOSIS — G43909 Migraine, unspecified, not intractable, without status migrainosus: Secondary | ICD-10-CM | POA: Diagnosis not present

## 2022-12-04 DIAGNOSIS — R1013 Epigastric pain: Secondary | ICD-10-CM | POA: Diagnosis not present

## 2022-12-04 DIAGNOSIS — I10 Essential (primary) hypertension: Secondary | ICD-10-CM

## 2022-12-04 DIAGNOSIS — R1011 Right upper quadrant pain: Secondary | ICD-10-CM

## 2022-12-04 HISTORY — PX: ESOPHAGOGASTRODUODENOSCOPY (EGD) WITH PROPOFOL: SHX5813

## 2022-12-04 HISTORY — PX: BIOPSY: SHX5522

## 2022-12-04 SURGERY — ESOPHAGOGASTRODUODENOSCOPY (EGD) WITH PROPOFOL
Anesthesia: General

## 2022-12-04 MED ORDER — GLYCOPYRROLATE 0.2 MG/ML IJ SOLN
INTRAMUSCULAR | Status: DC | PRN
  Administered 2022-12-04: .2 mg via INTRAVENOUS

## 2022-12-04 MED ORDER — LIDOCAINE HCL (CARDIAC) PF 100 MG/5ML IV SOSY
PREFILLED_SYRINGE | INTRAVENOUS | Status: DC | PRN
  Administered 2022-12-04: 80 mg via INTRAVENOUS

## 2022-12-04 MED ORDER — PROPOFOL 500 MG/50ML IV EMUL
INTRAVENOUS | Status: AC
  Filled 2022-12-04: qty 50

## 2022-12-04 MED ORDER — PROPOFOL 10 MG/ML IV BOLUS
INTRAVENOUS | Status: DC | PRN
  Administered 2022-12-04: 100 mg via INTRAVENOUS

## 2022-12-04 MED ORDER — EPHEDRINE SULFATE (PRESSORS) 50 MG/ML IJ SOLN
INTRAMUSCULAR | Status: DC | PRN
  Administered 2022-12-04: 10 mg via INTRAVENOUS
  Administered 2022-12-04: 5 mg via INTRAVENOUS
  Administered 2022-12-04: 10 mg via INTRAVENOUS

## 2022-12-04 MED ORDER — LIDOCAINE HCL (PF) 2 % IJ SOLN
INTRAMUSCULAR | Status: AC
  Filled 2022-12-04: qty 10

## 2022-12-04 MED ORDER — PROPOFOL 500 MG/50ML IV EMUL
INTRAVENOUS | Status: DC | PRN
  Administered 2022-12-04: 150 ug/kg/min via INTRAVENOUS

## 2022-12-04 MED ORDER — LACTATED RINGERS IV SOLN: INTRAVENOUS | Status: DC

## 2022-12-04 NOTE — Transfer of Care (Signed)
Immediate Anesthesia Transfer of Care Note  Patient: Judy Watts  Procedure(s) Performed: ESOPHAGOGASTRODUODENOSCOPY (EGD) WITH PROPOFOL BIOPSY  Patient Location: PACU  Anesthesia Type:General  Level of Consciousness: drowsy and patient cooperative  Airway & Oxygen Therapy: Patient Spontanous Breathing  Post-op Assessment: Report given to RN and Post -op Vital signs reviewed and stable  Post vital signs: Reviewed and stable  Last Vitals:  Vitals Value Taken Time  BP 145/68 12/04/22 1412  Temp 36.5 C 12/04/22 1412  Pulse 61 12/04/22 1412  Resp 13 12/04/22 1412  SpO2 99 % 12/04/22 1412    Last Pain:  Vitals:   12/04/22 1412  TempSrc:   PainSc: 0-No pain      Patients Stated Pain Goal: 6 (12/04/22 1248)  Complications: No notable events documented.

## 2022-12-04 NOTE — Op Note (Signed)
[facility] Patient Name: Judy Watts Procedure Date: 12/04/2022 1:46 PM MRN: [id number] Date of Birth: 1946-04-12 Attending MD: Katrinka Blazing , , [id number] CSN: [id number] Age: 77 Admit Type: Outpatient Procedure:                Upper GI endoscopy Indications:              Epigastric abdominal pain Providers:                Katrinka Blazing, Sharyne Richters Referring MD:              Medicines:                Monitored Anesthesia Care Complications:            No immediate complications. Estimated Blood Loss:     Estimated blood loss: none. Procedure:                Pre-Anesthesia Assessment:                           - Prior to the procedure, a History and Physical                            was performed, and patient medications, allergies                            and sensitivities were reviewed. The patient's                            tolerance of previous anesthesia was reviewed.                           - The risks and benefits of the procedure and the                            sedation options and risks were discussed with the                            patient. All questions were answered and informed                            consent was obtained.                           - ASA Grade Assessment: II - A patient with mild                            systemic disease.                           After obtaining informed consent, the endoscope was                            passed under direct vision. Throughout the                            procedure, the patient's blood pressure,  pulse, and                            oxygen saturations were monitored continuously. The                            [id number]-[id number] ([id number]) scope was introduced through the                            mouth, and advanced to the second part of duodenum.                            The upper GI endoscopy was accomplished without                            difficulty. The patient  tolerated the procedure                            well. Scope In: 2:01:25 PM Scope Out: 2:07:26 PM Total Procedure Duration: 0 hours 6 minutes 1 second  Findings:      The examined esophagus was normal.      The entire examined stomach was normal. Biopsies were taken with a cold       forceps for Helicobacter pylori testing.      The examined duodenum was normal. Impression:               - Normal esophagus.                           - Normal stomach. Biopsied.                           - Normal examined duodenum. Moderate Sedation:      Per Anesthesia Care Recommendation:           - Discharge patient to home (ambulatory).                           - Resume previous diet.                           - Await pathology results.                           - If normal biopsies, will proceed with RUQ [place]. Procedure Code(s):        --- Professional ---                           [phone], Esophagogastroduodenoscopy, flexible,                            transoral; with biopsy, single or multiple Diagnosis Code(s):        --- Professional ---                           R10.13, Epigastric pain CPT copyright 2022 American Medical Association. All rights reserved. The codes documented in this report are preliminary  and upon coder review may  be revised to meet current compliance requirements. Katrinka Blazing, MD Katrinka Blazing,  12/04/2022 2:12:07 PM This report has been signed electronically. Number of Addenda: 0

## 2022-12-04 NOTE — Anesthesia Postprocedure Evaluation (Signed)
Anesthesia Post Note  Patient: SEINI VOLLMAN  Procedure(s) Performed: ESOPHAGOGASTRODUODENOSCOPY (EGD) WITH PROPOFOL BIOPSY  Patient location during evaluation: Phase II Anesthesia Type: General Level of consciousness: awake and alert and oriented Pain management: pain level controlled Vital Signs Assessment: post-procedure vital signs reviewed and stable Respiratory status: spontaneous breathing, nonlabored ventilation and respiratory function stable Cardiovascular status: blood pressure returned to baseline and stable Postop Assessment: no apparent nausea or vomiting Anesthetic complications: no  No notable events documented.   Last Vitals:  Vitals:   12/04/22 1248 12/04/22 1412  BP: 128/81 (!) 145/68  Pulse: (!) 47 61  Resp: 15 13  Temp: 37.1 C 36.5 C  SpO2: 99% 99%    Last Pain:  Vitals:   12/04/22 1412  TempSrc:   PainSc: 0-No pain                 Jenesys Casseus C Zeddie Njie

## 2022-12-04 NOTE — Anesthesia Preprocedure Evaluation (Addendum)
Anesthesia Evaluation  Patient identified by MRN, date of birth, ID band Patient awake    Reviewed: Allergy & Precautions, H&P , NPO status , Patient's Chart, lab work & pertinent test results, reviewed documented beta blocker date and time   History of Anesthesia Complications Negative for: history of anesthetic complications  Airway Mallampati: II  TM Distance: >3 FB Neck ROM: Full    Dental  (+) Dental Advisory Given, Upper Dentures   Pulmonary neg pulmonary ROS   Pulmonary exam normal breath sounds clear to auscultation       Cardiovascular Exercise Tolerance: Good hypertension, Pt. on medications and Pt. on home beta blockers Normal cardiovascular exam Rhythm:Regular Rate:Normal     Neuro/Psych  Headaches  negative psych ROS   GI/Hepatic Neg liver ROS,GERD  Medicated,,  Endo/Other  negative endocrine ROS    Renal/GU negative Renal ROS  negative genitourinary   Musculoskeletal  (+) Arthritis , Osteoarthritis,    Abdominal   Peds negative pediatric ROS (+)  Hematology negative hematology ROS (+)   Anesthesia Other Findings   Reproductive/Obstetrics negative OB ROS                             Anesthesia Physical Anesthesia Plan  ASA: 2  Anesthesia Plan: General   Post-op Pain Management: Minimal or no pain anticipated   Induction: Intravenous  PONV Risk Score and Plan: 1  Airway Management Planned: Nasal Cannula and Natural Airway  Additional Equipment:   Intra-op Plan:   Post-operative Plan:   Informed Consent: I have reviewed the patients History and Physical, chart, labs and discussed the procedure including the risks, benefits and alternatives for the proposed anesthesia with the patient or authorized representative who has indicated his/her understanding and acceptance.     Dental advisory given  Plan Discussed with: CRNA and Surgeon  Anesthesia Plan  Comments:         Anesthesia Quick Evaluation  

## 2022-12-04 NOTE — Discharge Instructions (Signed)
You are being discharged to home.  Resume your previous diet.  We are waiting for your pathology results.  
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
## 2022-12-06 LAB — SURGICAL PATHOLOGY

## 2022-12-09 ENCOUNTER — Encounter (HOSPITAL_COMMUNITY): Payer: Self-pay | Admitting: Gastroenterology

## 2022-12-10 ENCOUNTER — Encounter (INDEPENDENT_AMBULATORY_CARE_PROVIDER_SITE_OTHER): Payer: Self-pay

## 2022-12-10 ENCOUNTER — Other Ambulatory Visit (INDEPENDENT_AMBULATORY_CARE_PROVIDER_SITE_OTHER): Payer: Self-pay | Admitting: Gastroenterology

## 2022-12-10 DIAGNOSIS — R1011 Right upper quadrant pain: Secondary | ICD-10-CM

## 2022-12-13 ENCOUNTER — Encounter (INDEPENDENT_AMBULATORY_CARE_PROVIDER_SITE_OTHER): Payer: Self-pay | Admitting: *Deleted

## 2022-12-23 ENCOUNTER — Encounter (INDEPENDENT_AMBULATORY_CARE_PROVIDER_SITE_OTHER): Payer: Self-pay

## 2022-12-23 ENCOUNTER — Ambulatory Visit (HOSPITAL_COMMUNITY)
Admission: RE | Admit: 2022-12-23 | Discharge: 2022-12-23 | Disposition: A | Discharge status: Home / Self Care | Payer: Medicare HMO | Source: Ambulatory Visit | Attending: Gastroenterology | Admitting: Gastroenterology

## 2022-12-23 DIAGNOSIS — R1011 Right upper quadrant pain: Secondary | ICD-10-CM | POA: Insufficient documentation

## 2023-01-28 ENCOUNTER — Encounter (INDEPENDENT_AMBULATORY_CARE_PROVIDER_SITE_OTHER): Payer: Self-pay | Admitting: Gastroenterology

## 2023-01-28 ENCOUNTER — Ambulatory Visit (INDEPENDENT_AMBULATORY_CARE_PROVIDER_SITE_OTHER): Payer: Medicare HMO | Admitting: Gastroenterology

## 2023-01-28 ENCOUNTER — Encounter: Payer: Medicare HMO | Attending: Internal Medicine

## 2023-01-28 ENCOUNTER — Telehealth: Payer: Self-pay

## 2023-01-28 VITALS — BP 129/77 | HR 58 | Temp 97.8°F | Resp 20

## 2023-01-28 VITALS — BP 122/79 | HR 73 | Temp 97.5°F | Ht 64.0 in | Wt 133.2 lb

## 2023-01-28 DIAGNOSIS — R1031 Right lower quadrant pain: Secondary | ICD-10-CM | POA: Diagnosis not present

## 2023-01-28 DIAGNOSIS — M810 Age-related osteoporosis without current pathological fracture: Secondary | ICD-10-CM

## 2023-01-28 DIAGNOSIS — R1011 Right upper quadrant pain: Secondary | ICD-10-CM

## 2023-01-28 DIAGNOSIS — I10 Essential (primary) hypertension: Secondary | ICD-10-CM | POA: Diagnosis not present

## 2023-01-28 MED ORDER — DENOSUMAB 60 MG/ML ~~LOC~~ SOSY
60.0000 mg | PREFILLED_SYRINGE | Freq: Once | SUBCUTANEOUS | Status: AC
  Administered 2023-01-28: 60 mg via SUBCUTANEOUS

## 2023-01-28 NOTE — Progress Notes (Signed)
Diagnosis: Osteoporosis  Provider:  Dwana Melena MD  Procedure: Injection  Prolia (Denosumab), Dose: 60 mg, Site: subcutaneous, Number of injections: 1  Left arm   Post Care: Patient declined observation  Discharge: Condition: Good, Destination: Home . AVS Provided  Performed by:  Marlow Baars [place], RN
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
## 2023-01-28 NOTE — Telephone Encounter (Signed)
Auth Submission: APPROVED Site of care: Site of care: [facility] Payer: [employer] Medication & CPT/J Code(s) submitted: Prolia (Denosumab) [id number] Route of submission (phone, fax, portal): Fax  Auth type: Buy/Bill HB Units/visits requested: 60mg  q 6 months Reference number: [id number] Approval from: 06/28/21 to 06/17/23

## 2023-01-28 NOTE — Patient Instructions (Addendum)
As discussed, I will talk with Dr. Levon Hedger regarding any other evaluations he feels may be beneficial. Reassuringly you have had a recent CT, [place], EGD and Colonoscopy that have all been mostly normal.  If you are interested in trying the low dose antidepressant to help with your pain (elavil/amitriptiline) please let me know.  Follow up 3 months

## 2023-01-30 ENCOUNTER — Ambulatory Visit: Payer: Medicare HMO

## 2023-01-30 ENCOUNTER — Encounter (HOSPITAL_COMMUNITY): Admission: RE | Admit: 2023-01-30 | Payer: Medicare HMO | Source: Ambulatory Visit

## 2023-02-04 ENCOUNTER — Other Ambulatory Visit (HOSPITAL_COMMUNITY): Payer: Self-pay | Admitting: Family Medicine

## 2023-02-04 ENCOUNTER — Encounter (HOSPITAL_COMMUNITY): Payer: Medicare HMO

## 2023-02-04 DIAGNOSIS — E782 Mixed hyperlipidemia: Secondary | ICD-10-CM | POA: Diagnosis not present

## 2023-02-04 DIAGNOSIS — M810 Age-related osteoporosis without current pathological fracture: Secondary | ICD-10-CM | POA: Diagnosis not present

## 2023-02-04 DIAGNOSIS — Z0000 Encounter for general adult medical examination without abnormal findings: Secondary | ICD-10-CM | POA: Diagnosis not present

## 2023-02-04 DIAGNOSIS — K219 Gastro-esophageal reflux disease without esophagitis: Secondary | ICD-10-CM | POA: Diagnosis not present

## 2023-02-04 DIAGNOSIS — N1831 Chronic kidney disease, stage 3a: Secondary | ICD-10-CM | POA: Diagnosis not present

## 2023-02-04 DIAGNOSIS — G43009 Migraine without aura, not intractable, without status migrainosus: Secondary | ICD-10-CM | POA: Diagnosis not present

## 2023-02-04 DIAGNOSIS — J302 Other seasonal allergic rhinitis: Secondary | ICD-10-CM | POA: Diagnosis not present

## 2023-02-04 DIAGNOSIS — H6122 Impacted cerumen, left ear: Secondary | ICD-10-CM | POA: Diagnosis not present

## 2023-02-04 DIAGNOSIS — G43019 Migraine without aura, intractable, without status migrainosus: Secondary | ICD-10-CM | POA: Diagnosis not present

## 2023-03-13 ENCOUNTER — Other Ambulatory Visit (HOSPITAL_COMMUNITY): Payer: Medicare HMO

## 2023-03-13 ENCOUNTER — Ambulatory Visit (HOSPITAL_COMMUNITY): Payer: Medicare HMO

## 2023-03-19 ENCOUNTER — Ambulatory Visit (HOSPITAL_COMMUNITY)
Admission: RE | Admit: 2023-03-19 | Discharge: 2023-03-19 | Disposition: A | Discharge status: Home / Self Care | Payer: Medicare HMO | Source: Ambulatory Visit | Attending: Internal Medicine | Admitting: Internal Medicine

## 2023-03-19 ENCOUNTER — Ambulatory Visit (HOSPITAL_COMMUNITY)
Admission: RE | Admit: 2023-03-19 | Discharge: 2023-03-19 | Disposition: A | Discharge status: Home / Self Care | Payer: Medicare HMO | Source: Ambulatory Visit | Attending: Family Medicine | Admitting: Family Medicine

## 2023-03-19 DIAGNOSIS — M810 Age-related osteoporosis without current pathological fracture: Secondary | ICD-10-CM | POA: Diagnosis not present

## 2023-03-19 DIAGNOSIS — Z1231 Encounter for screening mammogram for malignant neoplasm of breast: Secondary | ICD-10-CM | POA: Diagnosis not present

## 2023-03-19 DIAGNOSIS — M85832 Other specified disorders of bone density and structure, left forearm: Secondary | ICD-10-CM | POA: Diagnosis not present

## 2023-04-23 DIAGNOSIS — N9489 Other specified conditions associated with female genital organs and menstrual cycle: Secondary | ICD-10-CM | POA: Diagnosis not present

## 2023-04-23 DIAGNOSIS — N9089 Other specified noninflammatory disorders of vulva and perineum: Secondary | ICD-10-CM | POA: Diagnosis not present

## 2023-04-23 DIAGNOSIS — N904 Leukoplakia of vulva: Secondary | ICD-10-CM | POA: Diagnosis not present

## 2023-04-23 DIAGNOSIS — R300 Dysuria: Secondary | ICD-10-CM | POA: Diagnosis not present

## 2023-05-01 ENCOUNTER — Ambulatory Visit (INDEPENDENT_AMBULATORY_CARE_PROVIDER_SITE_OTHER): Payer: Medicare HMO | Admitting: Gastroenterology

## 2023-05-27 DIAGNOSIS — H2513 Age-related nuclear cataract, bilateral: Secondary | ICD-10-CM | POA: Diagnosis not present

## 2023-06-23 ENCOUNTER — Other Ambulatory Visit: Payer: Self-pay | Admitting: Pharmacy Technician

## 2023-06-23 ENCOUNTER — Telehealth: Payer: Self-pay

## 2023-06-23 NOTE — Telephone Encounter (Signed)
 Auth Submission: APPROVED Site of care: [facility] of care: [facility] Payer: [employer] medic[employer]  Medication & CPT/J Code(s) submitted: Prolia  (Denosumab ) [id number] Route of submission (phone, fax, portal): p Phone # Fax # Auth type: Buy/Bill PB Units/visits requested: 60mg , q37months Reference number: [id number] Approval from: 06/20/23 to 06/16/24

## 2023-08-01 ENCOUNTER — Ambulatory Visit: Payer: Medicare HMO

## 2023-08-01 DIAGNOSIS — E782 Mixed hyperlipidemia: Secondary | ICD-10-CM | POA: Diagnosis not present

## 2023-08-07 ENCOUNTER — Ambulatory Visit: Payer: Medicare HMO

## 2023-08-14 ENCOUNTER — Encounter: Payer: Medicare HMO | Attending: Internal Medicine | Admitting: *Deleted

## 2023-08-14 VITALS — BP 135/81 | HR 55 | Temp 97.5°F | Resp 16

## 2023-08-14 DIAGNOSIS — M810 Age-related osteoporosis without current pathological fracture: Secondary | ICD-10-CM | POA: Diagnosis not present

## 2023-08-14 MED ORDER — DENOSUMAB 60 MG/ML ~~LOC~~ SOSY
60.0000 mg | PREFILLED_SYRINGE | Freq: Once | SUBCUTANEOUS | Status: AC
  Administered 2023-08-14: 60 mg via SUBCUTANEOUS

## 2023-08-14 NOTE — Progress Notes (Signed)
 Diagnosis: Osteoporosis  Provider:  Dwana Melena MD  Procedure: Injection  Prolia (Denosumab), Dose: 60 mg, Site: subcutaneous, Number of injections: 1  Injection Site(s): Left arm  Post Care: Observation period completed  Discharge: Condition: Good, Destination: Home . AVS Provided  Performed by:  Daleen Squibb, RN

## 2023-08-25 DIAGNOSIS — H2513 Age-related nuclear cataract, bilateral: Secondary | ICD-10-CM | POA: Diagnosis not present

## 2023-09-02 DIAGNOSIS — G43019 Migraine without aura, intractable, without status migrainosus: Secondary | ICD-10-CM | POA: Diagnosis not present

## 2023-09-02 DIAGNOSIS — R000 Tachycardia, unspecified: Secondary | ICD-10-CM | POA: Diagnosis not present

## 2023-09-02 DIAGNOSIS — E782 Mixed hyperlipidemia: Secondary | ICD-10-CM | POA: Diagnosis not present

## 2023-09-02 DIAGNOSIS — N182 Chronic kidney disease, stage 2 (mild): Secondary | ICD-10-CM | POA: Diagnosis not present

## 2023-09-02 DIAGNOSIS — I129 Hypertensive chronic kidney disease with stage 1 through stage 4 chronic kidney disease, or unspecified chronic kidney disease: Secondary | ICD-10-CM | POA: Diagnosis not present

## 2023-09-02 DIAGNOSIS — E876 Hypokalemia: Secondary | ICD-10-CM | POA: Diagnosis not present

## 2023-09-02 DIAGNOSIS — I10 Essential (primary) hypertension: Secondary | ICD-10-CM | POA: Diagnosis not present

## 2023-09-02 DIAGNOSIS — K219 Gastro-esophageal reflux disease without esophagitis: Secondary | ICD-10-CM | POA: Diagnosis not present

## 2023-09-02 DIAGNOSIS — M810 Age-related osteoporosis without current pathological fracture: Secondary | ICD-10-CM | POA: Diagnosis not present

## 2023-09-11 DIAGNOSIS — I10 Essential (primary) hypertension: Secondary | ICD-10-CM | POA: Diagnosis not present

## 2023-09-11 DIAGNOSIS — H2512 Age-related nuclear cataract, left eye: Secondary | ICD-10-CM | POA: Diagnosis not present

## 2023-09-25 DIAGNOSIS — I10 Essential (primary) hypertension: Secondary | ICD-10-CM | POA: Diagnosis not present

## 2023-09-25 DIAGNOSIS — F419 Anxiety disorder, unspecified: Secondary | ICD-10-CM | POA: Diagnosis not present

## 2023-09-25 DIAGNOSIS — H2511 Age-related nuclear cataract, right eye: Secondary | ICD-10-CM | POA: Diagnosis not present

## 2023-10-16 ENCOUNTER — Other Ambulatory Visit (HOSPITAL_COMMUNITY): Payer: Self-pay | Admitting: Nurse Practitioner

## 2023-10-16 ENCOUNTER — Ambulatory Visit (HOSPITAL_COMMUNITY)
Admission: RE | Admit: 2023-10-16 | Discharge: 2023-10-16 | Disposition: A | Discharge status: Home / Self Care | Source: Ambulatory Visit | Attending: Nurse Practitioner | Admitting: Nurse Practitioner

## 2023-10-16 DIAGNOSIS — R059 Cough, unspecified: Secondary | ICD-10-CM | POA: Insufficient documentation

## 2023-10-16 DIAGNOSIS — Z981 Arthrodesis status: Secondary | ICD-10-CM | POA: Diagnosis not present

## 2024-01-12 DIAGNOSIS — R000 Tachycardia, unspecified: Secondary | ICD-10-CM | POA: Diagnosis not present

## 2024-01-12 DIAGNOSIS — R1031 Right lower quadrant pain: Secondary | ICD-10-CM | POA: Diagnosis not present

## 2024-01-12 DIAGNOSIS — R110 Nausea: Secondary | ICD-10-CM | POA: Diagnosis not present

## 2024-01-12 DIAGNOSIS — G8929 Other chronic pain: Secondary | ICD-10-CM | POA: Diagnosis not present

## 2024-01-12 DIAGNOSIS — G4700 Insomnia, unspecified: Secondary | ICD-10-CM | POA: Diagnosis not present

## 2024-01-12 DIAGNOSIS — G43909 Migraine, unspecified, not intractable, without status migrainosus: Secondary | ICD-10-CM | POA: Diagnosis not present

## 2024-02-05 DIAGNOSIS — I10 Essential (primary) hypertension: Secondary | ICD-10-CM | POA: Diagnosis not present

## 2024-02-05 DIAGNOSIS — L989 Disorder of the skin and subcutaneous tissue, unspecified: Secondary | ICD-10-CM | POA: Diagnosis not present

## 2024-02-05 DIAGNOSIS — M810 Age-related osteoporosis without current pathological fracture: Secondary | ICD-10-CM | POA: Diagnosis not present

## 2024-02-11 DIAGNOSIS — I10 Essential (primary) hypertension: Secondary | ICD-10-CM | POA: Diagnosis not present

## 2024-02-11 DIAGNOSIS — K219 Gastro-esophageal reflux disease without esophagitis: Secondary | ICD-10-CM | POA: Diagnosis not present

## 2024-02-11 DIAGNOSIS — R000 Tachycardia, unspecified: Secondary | ICD-10-CM | POA: Diagnosis not present

## 2024-02-11 DIAGNOSIS — I129 Hypertensive chronic kidney disease with stage 1 through stage 4 chronic kidney disease, or unspecified chronic kidney disease: Secondary | ICD-10-CM | POA: Diagnosis not present

## 2024-02-11 DIAGNOSIS — Z23 Encounter for immunization: Secondary | ICD-10-CM | POA: Diagnosis not present

## 2024-02-11 DIAGNOSIS — E782 Mixed hyperlipidemia: Secondary | ICD-10-CM | POA: Diagnosis not present

## 2024-02-11 DIAGNOSIS — N182 Chronic kidney disease, stage 2 (mild): Secondary | ICD-10-CM | POA: Diagnosis not present

## 2024-02-11 DIAGNOSIS — Z0001 Encounter for general adult medical examination with abnormal findings: Secondary | ICD-10-CM | POA: Diagnosis not present

## 2024-02-11 DIAGNOSIS — Z0000 Encounter for general adult medical examination without abnormal findings: Secondary | ICD-10-CM | POA: Diagnosis not present

## 2024-02-19 ENCOUNTER — Encounter: Payer: Medicare HMO | Attending: Internal Medicine | Admitting: *Deleted

## 2024-02-19 VITALS — BP 134/73 | HR 49 | Resp 16

## 2024-02-19 DIAGNOSIS — M810 Age-related osteoporosis without current pathological fracture: Secondary | ICD-10-CM | POA: Diagnosis not present

## 2024-02-19 MED ORDER — DENOSUMAB 60 MG/ML ~~LOC~~ SOSY
60.0000 mg | PREFILLED_SYRINGE | Freq: Once | SUBCUTANEOUS | Status: AC
  Administered 2024-02-19: 60 mg via SUBCUTANEOUS

## 2024-02-19 NOTE — Progress Notes (Signed)
 Diagnosis: Osteoporosis  Provider:  Dwana Melena MD  Procedure: Injection  Prolia (Denosumab), Dose: 60 mg, Site: subcutaneous, Number of injections: 1  Injection Site(s): Left arm  Post Care: Observation period completed  Discharge: Condition: Good, Destination: Home . AVS Provided  Performed by:  Daleen Squibb, RN

## 2024-03-15 ENCOUNTER — Ambulatory Visit (INDEPENDENT_AMBULATORY_CARE_PROVIDER_SITE_OTHER)

## 2024-03-15 ENCOUNTER — Encounter: Payer: Self-pay | Admitting: Podiatry

## 2024-03-15 ENCOUNTER — Ambulatory Visit: Admitting: Podiatry

## 2024-03-15 ENCOUNTER — Ambulatory Visit: Payer: Medicare HMO

## 2024-03-15 VITALS — Ht 64.0 in | Wt 133.0 lb

## 2024-03-15 DIAGNOSIS — M778 Other enthesopathies, not elsewhere classified: Secondary | ICD-10-CM

## 2024-03-15 DIAGNOSIS — M7752 Other enthesopathy of left foot: Secondary | ICD-10-CM

## 2024-03-15 DIAGNOSIS — S92902A Unspecified fracture of left foot, initial encounter for closed fracture: Secondary | ICD-10-CM

## 2024-03-15 MED ORDER — TRIAMCINOLONE ACETONIDE 10 MG/ML IJ SUSP
10.0000 mg | Freq: Once | INTRAMUSCULAR | Status: AC
Start: 2024-03-15 — End: 2024-03-15
  Administered 2024-03-15: 10 mg via INTRA_ARTICULAR
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
## 2024-03-23 ENCOUNTER — Ambulatory Visit: Payer: Medicare HMO

## 2024-03-31 ENCOUNTER — Encounter (INDEPENDENT_AMBULATORY_CARE_PROVIDER_SITE_OTHER): Payer: Self-pay | Admitting: Gastroenterology

## 2024-04-16 ENCOUNTER — Telehealth: Payer: Self-pay

## 2024-04-16 NOTE — Telephone Encounter (Signed)
 Auth Submission: APPROVED Site of care: [facility] of care: [facility] Payer: [employer] medicare Medication & CPT/J Code(s) submitted: Prolia  (Denosumab ) [id number] Diagnosis Code:  Route of submission (phone, fax, portal): portal Phone # Fax # Auth type: Buy/Bill PB Units/visits requested: 60mg  q17months  Reference number: [id number] Approval from: 06/17/24 to 06/16/25

## 2024-05-12 DIAGNOSIS — G8929 Other chronic pain: Secondary | ICD-10-CM | POA: Diagnosis not present

## 2024-05-12 DIAGNOSIS — Z0000 Encounter for general adult medical examination without abnormal findings: Secondary | ICD-10-CM | POA: Diagnosis not present

## 2024-05-12 DIAGNOSIS — G4700 Insomnia, unspecified: Secondary | ICD-10-CM | POA: Diagnosis not present

## 2024-06-03 ENCOUNTER — Encounter (INDEPENDENT_AMBULATORY_CARE_PROVIDER_SITE_OTHER): Payer: Self-pay | Admitting: Gastroenterology

## 2024-06-03 ENCOUNTER — Ambulatory Visit (INDEPENDENT_AMBULATORY_CARE_PROVIDER_SITE_OTHER): Admitting: Gastroenterology

## 2024-06-03 VITALS — BP 129/85 | HR 51 | Temp 98.0°F | Ht 64.0 in | Wt 137.3 lb

## 2024-06-03 DIAGNOSIS — R197 Diarrhea, unspecified: Secondary | ICD-10-CM | POA: Insufficient documentation

## 2024-06-03 DIAGNOSIS — R1011 Right upper quadrant pain: Secondary | ICD-10-CM

## 2024-06-03 DIAGNOSIS — R109 Unspecified abdominal pain: Secondary | ICD-10-CM
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
## 2024-06-03 NOTE — Patient Instructions (Signed)
 Perform stool workup If persistent symptoms of diarrhea and worsening pain, will discuss possibility of starting low-dose TCA's

## 2024-06-04 ENCOUNTER — Ambulatory Visit: Admitting: Orthopedic Surgery

## 2024-06-04 ENCOUNTER — Other Ambulatory Visit: Payer: Self-pay

## 2024-06-04 VITALS — BP 129/85 | Ht 64.0 in | Wt 137.0 lb

## 2024-06-04 DIAGNOSIS — M25521 Pain in right elbow: Secondary | ICD-10-CM | POA: Diagnosis not present

## 2024-06-04 MED ORDER — METHYLPREDNISOLONE ACETATE 40 MG/ML IJ SUSP
40.0000 mg | Freq: Once | INTRAMUSCULAR | Status: AC
  Administered 2024-06-04: 40 mg via INTRA_ARTICULAR

## 2024-06-04 NOTE — Progress Notes (Signed)
" °  Intake history:  Chief Complaint  Patient presents with   Elbow Pain    Right      BP 129/85 Comment: 06/03/24  Ht 5' 4 (1.626 m)   Wt 137 lb (62.1 kg)   BMI 23.52 kg/m  Body mass index is 23.52 kg/m.  Pharmacy? ____[facility] __________________________________  WHAT ARE WE SEEING YOU FOR TODAY?   Right elbow  How long has this bothered you? (DOI?DOS?WS?)  approximately 1 month(s) ago  Was there an injury? No  Anticoag.  No   Any ALLERGIES _________Allergies[1] _____________________________________   Treatment:  Have you taken:  Tylenol  Yes  Advil Yes  Had PT No  Had injection No  Other  _________________________        [1] No Known Allergies  "

## 2024-06-04 NOTE — Progress Notes (Signed)
" ° °  Chief Complaint  Patient presents with   Elbow Pain    Right     HPI-78 year old female with medial elbow pain which may have been brought on by helping her husband with his office work then this was followed by hitting the elbow against the counter complains of medial elbow pain nonradiating no evidence of ulnar neuropathy  PHYSICAL EXAM:  Tenderness over the medial epicondyle pain with flexion extension of the wrist Normal sensation  X-ray negative Assessment and Plan:   Probable medial epicondylitis recommend injection  Injection medial epicondyle right elbow  Area maximal tenderness just off the medial epicondyle was isolated cleaned and sprayed with ethyl chloride we then used a 25-gauge needle directed in the area and injected 40 mg of Depo-Medrol  2 cc 1% lidocaine  and there were no complications  The patient gave verbal consent we confirmed the site of injection with appropriate timeout  Patient follow-up as needed "

## 2024-07-06 ENCOUNTER — Ambulatory Visit (INDEPENDENT_AMBULATORY_CARE_PROVIDER_SITE_OTHER): Admitting: Gastroenterology

## 2024-08-19 ENCOUNTER — Ambulatory Visit
